# Patient Record
Sex: Male | Born: 1955 | Race: Black or African American | Hispanic: No | Marital: Married | Smoking: Never smoker
Health system: Southern US, Community
[De-identification: ages and names within clinical notes are randomized; demographics above are authoritative.]

## PROBLEM LIST (undated history)

## (undated) DIAGNOSIS — E7800 Pure hypercholesterolemia, unspecified: Secondary | ICD-10-CM

## (undated) DIAGNOSIS — I10 Essential (primary) hypertension: Secondary | ICD-10-CM

## (undated) HISTORY — DX: Pure hypercholesterolemia, unspecified: E78.00

## (undated) HISTORY — DX: Essential (primary) hypertension: I10

---

## 2013-05-25 DIAGNOSIS — K200 Eosinophilic esophagitis: Secondary | ICD-10-CM | POA: Insufficient documentation

## 2013-05-25 DIAGNOSIS — R1310 Dysphagia, unspecified: Secondary | ICD-10-CM | POA: Insufficient documentation

## 2018-03-08 ENCOUNTER — Emergency Department (HOSPITAL_COMMUNITY): Payer: Self-pay

## 2018-03-08 ENCOUNTER — Encounter (HOSPITAL_COMMUNITY): Payer: Self-pay | Admitting: Emergency Medicine

## 2018-03-08 ENCOUNTER — Other Ambulatory Visit: Payer: Self-pay

## 2018-03-08 ENCOUNTER — Emergency Department (HOSPITAL_COMMUNITY)
Admission: EM | Admit: 2018-03-08 | Discharge: 2018-03-08 | Disposition: A | Discharge status: Home / Self Care | Payer: Self-pay | Attending: Emergency Medicine | Admitting: Emergency Medicine

## 2018-03-08 DIAGNOSIS — K5900 Constipation, unspecified: Secondary | ICD-10-CM | POA: Insufficient documentation

## 2018-03-08 DIAGNOSIS — N289 Disorder of kidney and ureter, unspecified: Secondary | ICD-10-CM

## 2018-03-08 LAB — CBC
HCT: 47.5 % (ref 39.0–52.0)
Hemoglobin: 15.6 g/dL (ref 13.0–17.0)
MCH: 29.8 pg (ref 26.0–34.0)
MCHC: 32.8 g/dL (ref 30.0–36.0)
MCV: 90.6 fL (ref 80.0–100.0)
NRBC: 0 % (ref 0.0–0.2)
PLATELETS: 226 10*3/uL (ref 150–400)
RBC: 5.24 MIL/uL (ref 4.22–5.81)
RDW: 11.6 % (ref 11.5–15.5)
WBC: 11.7 10*3/uL — AB (ref 4.0–10.5)

## 2018-03-08 LAB — COMPREHENSIVE METABOLIC PANEL
ALK PHOS: 47 U/L (ref 38–126)
ALT: 13 U/L (ref 0–44)
ANION GAP: 11 (ref 5–15)
AST: 14 U/L — ABNORMAL LOW (ref 15–41)
Albumin: 3.9 g/dL (ref 3.5–5.0)
BILIRUBIN TOTAL: 1.5 mg/dL — AB (ref 0.3–1.2)
BUN: 19 mg/dL (ref 8–23)
CO2: 25 mmol/L (ref 22–32)
Calcium: 9.2 mg/dL (ref 8.9–10.3)
Chloride: 101 mmol/L (ref 98–111)
Creatinine, Ser: 2.07 mg/dL — ABNORMAL HIGH (ref 0.61–1.24)
GFR, EST AFRICAN AMERICAN: 38 mL/min — AB (ref >60)
GFR, EST NON AFRICAN AMERICAN: 33 mL/min — AB (ref >60)
Glucose, Bld: 109 mg/dL — ABNORMAL HIGH (ref 70–99)
Potassium: 4.4 mmol/L (ref 3.5–5.1)
Sodium: 137 mmol/L (ref 135–145)
TOTAL PROTEIN: 7.6 g/dL (ref 6.5–8.1)

## 2018-03-08 LAB — URINALYSIS, ROUTINE W REFLEX MICROSCOPIC
Bacteria, UA: NONE SEEN
Bilirubin Urine: NEGATIVE
GLUCOSE, UA: NEGATIVE mg/dL
Ketones, ur: 5 mg/dL — AB
Nitrite: NEGATIVE
PH: 6 (ref 5.0–8.0)
PROTEIN: NEGATIVE mg/dL
Specific Gravity, Urine: 1.019 (ref 1.005–1.030)

## 2018-03-08 LAB — LIPASE, BLOOD: Lipase: 33 U/L (ref 11–51)

## 2018-03-08 MED ORDER — MILK AND MOLASSES ENEMA
1.0000 | Freq: Once | RECTAL | Status: AC
  Administered 2018-03-08: 250 mL via RECTAL
  Filled 2018-03-08: qty 250

## 2018-03-08 NOTE — Discharge Instructions (Addendum)
Drink plenty of fluids. Take miralax 1 to 3 times daily. Take colace 1 capsule daily. Take magnesium citrate or senna for severe constipation. Follow up with primary doctor for your kidney function.

## 2018-03-08 NOTE — ED Provider Notes (Signed)
[facility] EMERGENCY DEPARTMENT Provider Note   CSN: [id number] Arrival date & time: 03/08/18  0428     History   Chief Complaint Chief Complaint  Patient presents with  . Constipation    HPI Raymond Cruz is a 62 y.o. male.  62 year old male who presents with constipation and bloating.  Patient states that for the past 4 days, he has had constipation associated with bloating.  At one point he had some abdominal pain but does not have any pain currently.  He has tried 2 bottles of magnesium citrate, Dulcolax, Epsom salts, warm towels, and fleets enema with no relief.  He passed several hard balls of stool 2 days ago but nothing since then.  He is passing gas.  He has had nausea but no vomiting and no fevers or urinary symptoms.  The history is provided by the patient.  Constipation      History reviewed. No pertinent past medical history.  There are no active problems to display for this patient.   History reviewed. No pertinent surgical history.      Home Medications    Prior to Admission medications   Not on File    Family History History reviewed. No pertinent family history.  Social History Social History   Tobacco Use  . Smoking status: Never Smoker  . Smokeless tobacco: Never Used  Substance Use Topics  . Alcohol use: Never    Frequency: Never  . Drug use: Never     Allergies   Patient has no known allergies.   Review of Systems Review of Systems  Gastrointestinal: Positive for constipation.   All other systems reviewed and are negative except that which was mentioned in HPI   Physical Exam Updated Vital Signs BP 132/80   Pulse (!) 58   Temp 98 F (36.7 C) (Oral)   Resp (!) 25   Ht 5\' 8"  (1.727 m)   Wt 99.8 kg   SpO2 96%   BMI 33.45 kg/m   Physical Exam  Constitutional: He is oriented to person, place, and time. He appears well-developed and well-nourished. No distress.  HENT:  Head: Normocephalic and  atraumatic.  Moist mucous membranes  Eyes: Conjunctivae are normal.  Neck: Neck supple.  Cardiovascular: Normal rate, regular rhythm and normal heart sounds.  No murmur heard. Pulmonary/Chest: Effort normal and breath sounds normal.  Abdominal: Soft. He exhibits no distension. Bowel sounds are increased. There is no tenderness.  Musculoskeletal: He exhibits no edema.  Neurological: He is alert and oriented to person, place, and time.  Fluent speech  Skin: Skin is warm and dry.  Psychiatric: He has a normal mood and affect. Judgment normal.  Nursing note and vitals reviewed.    ED Treatments / Results  Labs (all labs ordered are listed, but only abnormal results are displayed) Labs Reviewed  COMPREHENSIVE METABOLIC PANEL - Abnormal; Notable for the following components:      Result Value   Glucose, Bld 109 (*)    Creatinine, Ser 2.07 (*)    AST 14 (*)    Total Bilirubin 1.5 (*)    GFR calc non Af Amer 33 (*)    GFR calc Af Amer 38 (*)    All other components within normal limits  CBC - Abnormal; Notable for the following components:   WBC 11.7 (*)    All other components within normal limits  URINALYSIS, ROUTINE W REFLEX MICROSCOPIC - Abnormal; Notable for the following components:   Hgb urine  dipstick MODERATE (*)    Ketones, ur 5 (*)    Leukocytes, UA TRACE (*)    All other components within normal limits  LIPASE, BLOOD    EKG None  Radiology Dg Abd 2 Views  Result Date: 03/08/2018 CLINICAL DATA:  Initial evaluation for acute abdominal pain, constipation. EXAM: ABDOMEN - 2 VIEW COMPARISON:  None. FINDINGS: Bowel gas pattern within normal limits without obstruction or ileus. No abnormal bowel wall thickening. No free air seen underneath the hemidiaphragms on upright view. Overall stool burden is mild in nature. No soft tissue mass or abnormal calcification. Visualized osseous structures within normal limits. IMPRESSION: 1. Nonobstructive bowel gas pattern with no  radiographic evidence for acute intra-abdominal process. 2. Overall stool burden is relatively mild in nature. Electronically Signed   By: Rise Mu M.D.   On: 03/08/2018 06:32    Procedures Procedures (including critical care time)  Medications Ordered in ED Medications  milk and molasses enema (has no administration in time range)     Initial Impression / Assessment and Plan / ED Course  I have reviewed the triage vital signs and the nursing notes.  Pertinent labs & imaging results that were available during my care of the patient were reviewed by me and considered in my medical decision making (see chart for details).    Well-appearing on exam, abdomen nontender, he did have good bowel sounds.  He denies any abdominal pain.  His lab work shows normal LFTs and lipase.  Incidental finding of creatinine of 2.  The patient has had no vomiting or decreased intake to suggest dehydration.  He never goes to the doctor and is been a long time since he has had a well check, I suspect he may have chronic kidney disease potentially related to hypertension.  His wife assures me that they will contact the clinic tomorrow for appointment for further work-up of his kidney disease.  In the meantime, he will continue to drink plenty of fluids.  Running his constipation, gave enema in the ED after which patient had large bowel movement and stated he felt much better.  Discussed supportive measures including medications to continue at home.  Extensively reviewed return precautions and they voiced understanding.  Final Clinical Impressions(s) / ED Diagnoses   Final diagnoses:  Constipation    ED Discharge Orders    None       Cookie Pore, Ambrose Finland, MD 03/08/18 [phone]

## 2018-03-08 NOTE — ED Triage Notes (Signed)
Pt given M/M enema  With results

## 2018-03-08 NOTE — ED Triage Notes (Signed)
Pt reports constipation, abdominal pain for past 4 days. Reports small, meatball-size stool 2 days ago, but only small liquid since then. Passing gas today. Mild nausea during period as well. Has tried PO treatments without relief.

## 2018-09-22 ENCOUNTER — Ambulatory Visit (INDEPENDENT_AMBULATORY_CARE_PROVIDER_SITE_OTHER): Payer: BLUE CROSS/BLUE SHIELD

## 2018-09-22 ENCOUNTER — Encounter: Payer: Self-pay | Admitting: Podiatry

## 2018-09-22 ENCOUNTER — Other Ambulatory Visit: Payer: Self-pay

## 2018-09-22 ENCOUNTER — Ambulatory Visit: Payer: BLUE CROSS/BLUE SHIELD | Admitting: Podiatry

## 2018-09-22 VITALS — Temp 97.7°F

## 2018-09-22 DIAGNOSIS — T148XXA Other injury of unspecified body region, initial encounter: Secondary | ICD-10-CM

## 2018-09-22 DIAGNOSIS — M792 Neuralgia and neuritis, unspecified: Secondary | ICD-10-CM | POA: Diagnosis not present

## 2018-09-22 DIAGNOSIS — S9032XA Contusion of left foot, initial encounter: Secondary | ICD-10-CM

## 2018-09-22 MED ORDER — DICLOFENAC SODIUM 1 % TD GEL: 2.0000 g | Freq: Four times a day (QID) | TRANSDERMAL | 0 refills | Status: DC

## 2018-09-22 NOTE — Progress Notes (Signed)
Subjective:   Patient ID: Raymond Cruz, male   DOB: 63 y.o.   MRN: [id number]   HPI 63 year old male presents the office today for concerns of numbness across the top of his right first toe from the MPJ.  States he dropped a heavy piece of metal on the foot about 2 weeks ago and was swollen and he had bruising to the area.  He states that he did not feel he broke anything he feels that the bone is bruised but he wants to have the area checked.  He has been soaking his foot.  This did occur while he was at work.  He is able to wear regular shoe without any pain.  He states that he just wants to have the area checked.  Review of Systems  All other systems reviewed and are negative.  No past medical history on file.  No past surgical history on file.   Current Outpatient Medications:  .  amoxicillin (AMOXIL) 500 MG tablet, TAKE 1 TABLET BY MOUTH EVERY 8 HOURS UNTIL GONE, Disp: , Rfl:  .  diclofenac sodium (VOLTAREN) 1 % GEL, Apply 2 g topically 4 (four) times daily. Rub into affected area of foot 2 to 4 times daily, Disp: 100 g, Rfl: 0  No Known Allergies      Objective:  Physical Exam  General: AAO x3, NAD  Dermatological: Evidence of old bruising to the hallux proximal plantar aspect as well as medial aspect.  No open sores.  There are no open sores, no preulcerative lesions, no rash or signs of infection present.  Vascular: Dorsalis Pedis artery and Posterior Tibial artery pedal pulses are 2/4 bilateral with immedate capillary fill time. Pedal hair growth present. No varicosities and no lower extremity edema present bilateral. There is no pain with calf compression, swelling, warmth, erythema.   Neruologic: Grossly intact via light touch bilateral. Vibratory intact via tuning fork bilateral. Protective threshold with Semmes Wienstein monofilament intact to all pedal sites bilateral.  Negative Tinel sign.  Musculoskeletal: There is not significant numbness to the dorsal aspect of  the right foot mostly along the hallux.  There is no pain to palpation.  There is no significant edema.  There is no pain with MPJ range of motion.  There is no other areas of pinpoint tenderness.  No pain palpated sensation.  Muscular strength 5/5 in all groups tested bilateral.  Gait: Unassisted, Nonantalgic.       Assessment:   64 year old male left foot bone contusion, neuritis     Plan:  -Treatment options discussed including all alternatives, risks, and complications -Etiology of symptoms were discussed -X-rays were obtained and reviewed with the patient.  There is no definitive evidence of acute fracture identified today. -He is able to wear shoes with any difficulties today, cannot localize it.  He states that wearing stiffer shoes also been very helpful at work.  Prescribed Voltaren gel to see if this will help as well.  If not resolved next 4 weeks to let me know.  We discussed that next infections can take some time to heal.  Vivi Barrack DPM

## 2018-10-16 ENCOUNTER — Other Ambulatory Visit: Payer: Self-pay | Admitting: Podiatry

## 2019-11-10 ENCOUNTER — Ambulatory Visit: Payer: BLUE CROSS/BLUE SHIELD | Attending: Internal Medicine

## 2019-11-10 DIAGNOSIS — Z20822 Contact with and (suspected) exposure to covid-19: Secondary | ICD-10-CM

## 2019-11-11 LAB — NOVEL CORONAVIRUS, NAA: SARS-CoV-2, NAA: NOT DETECTED

## 2019-11-11 LAB — SARS-COV-2, NAA 2 DAY TAT

## 2020-04-17 IMAGING — DX DG ABDOMEN 2V
2 series · 2 of 2 positions shown · non-contrast
Comparison: None.

CLINICAL DATA: Initial evaluation for acute abdominal pain,
constipation.

EXAM:
ABDOMEN - 2 VIEW

[abdomen erect]
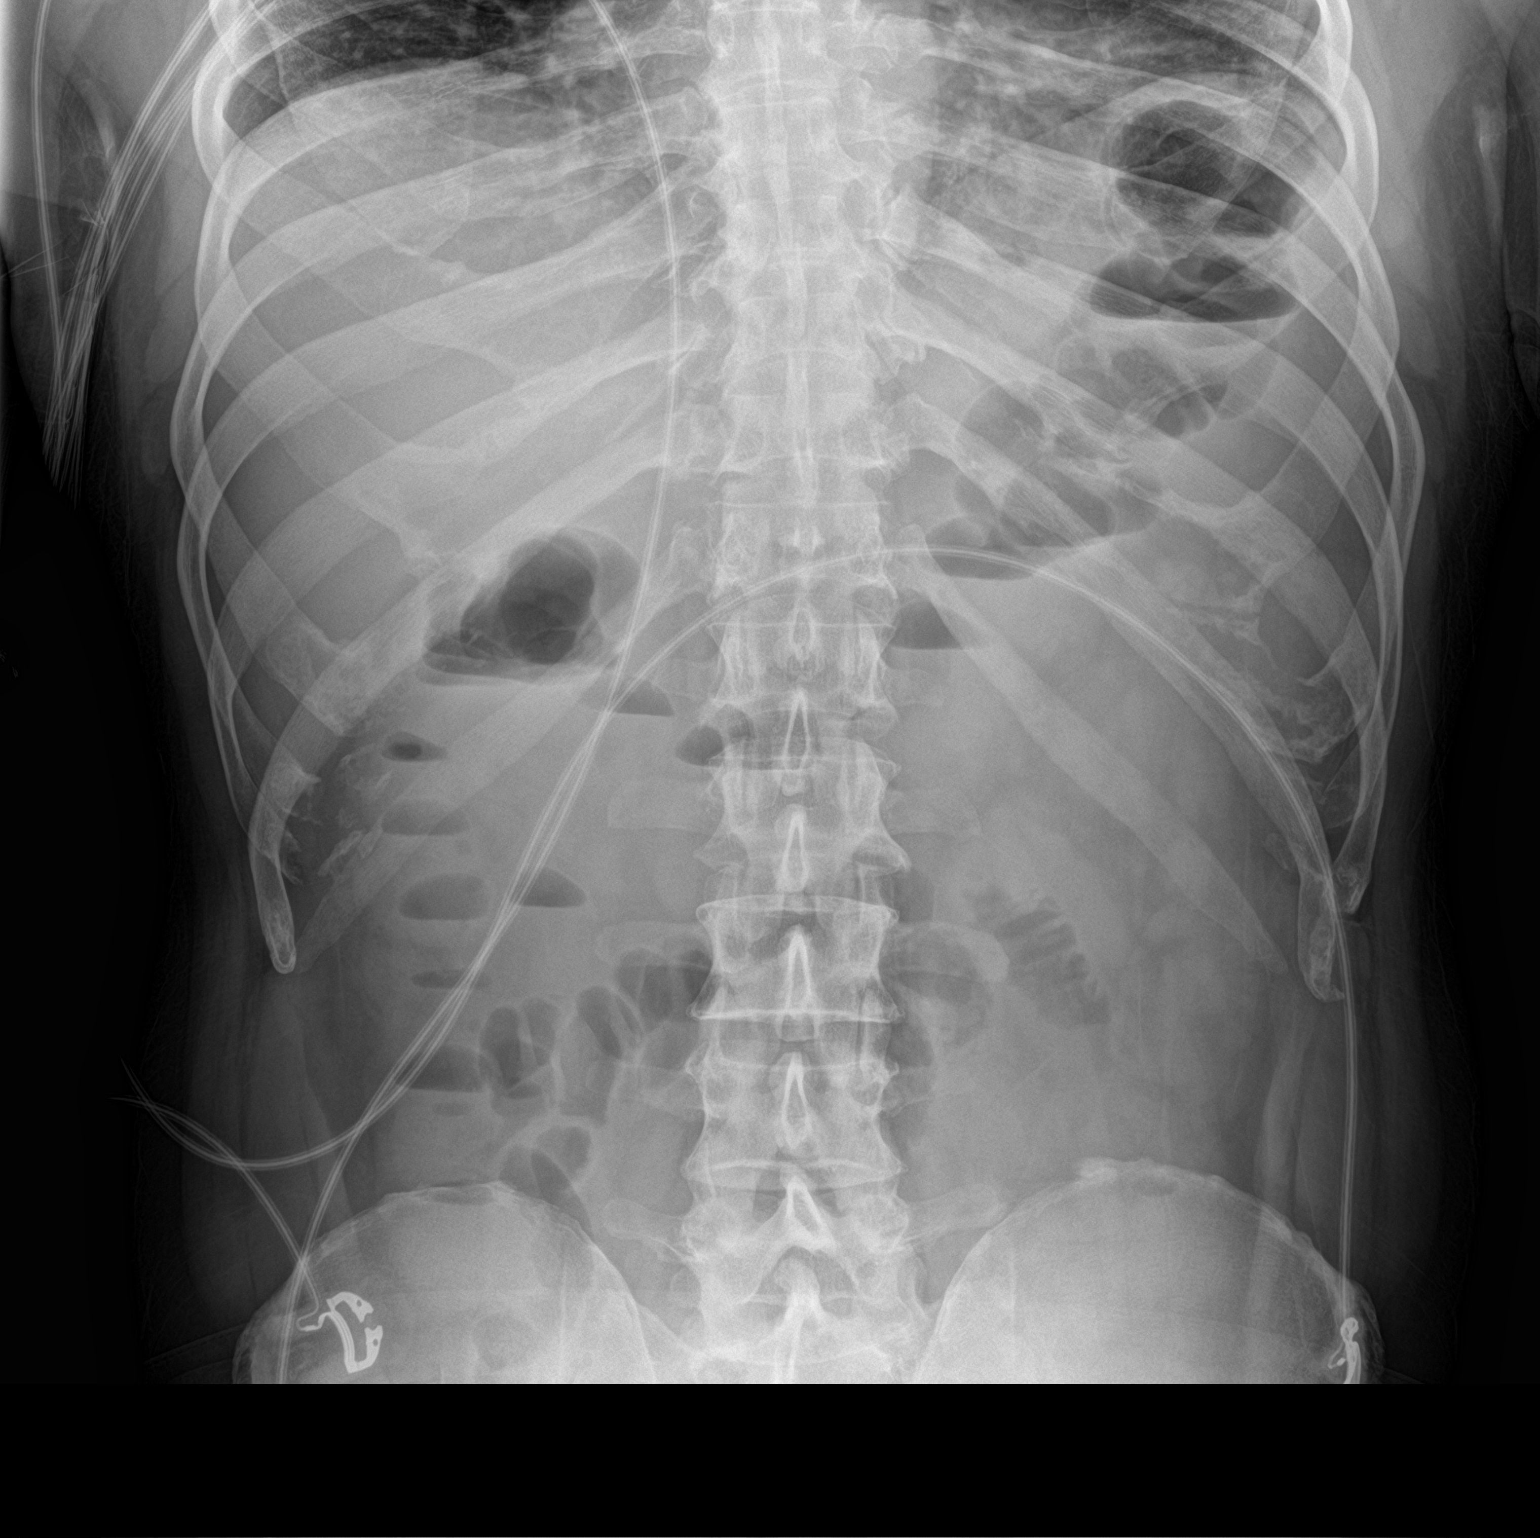

[abdomen supine]
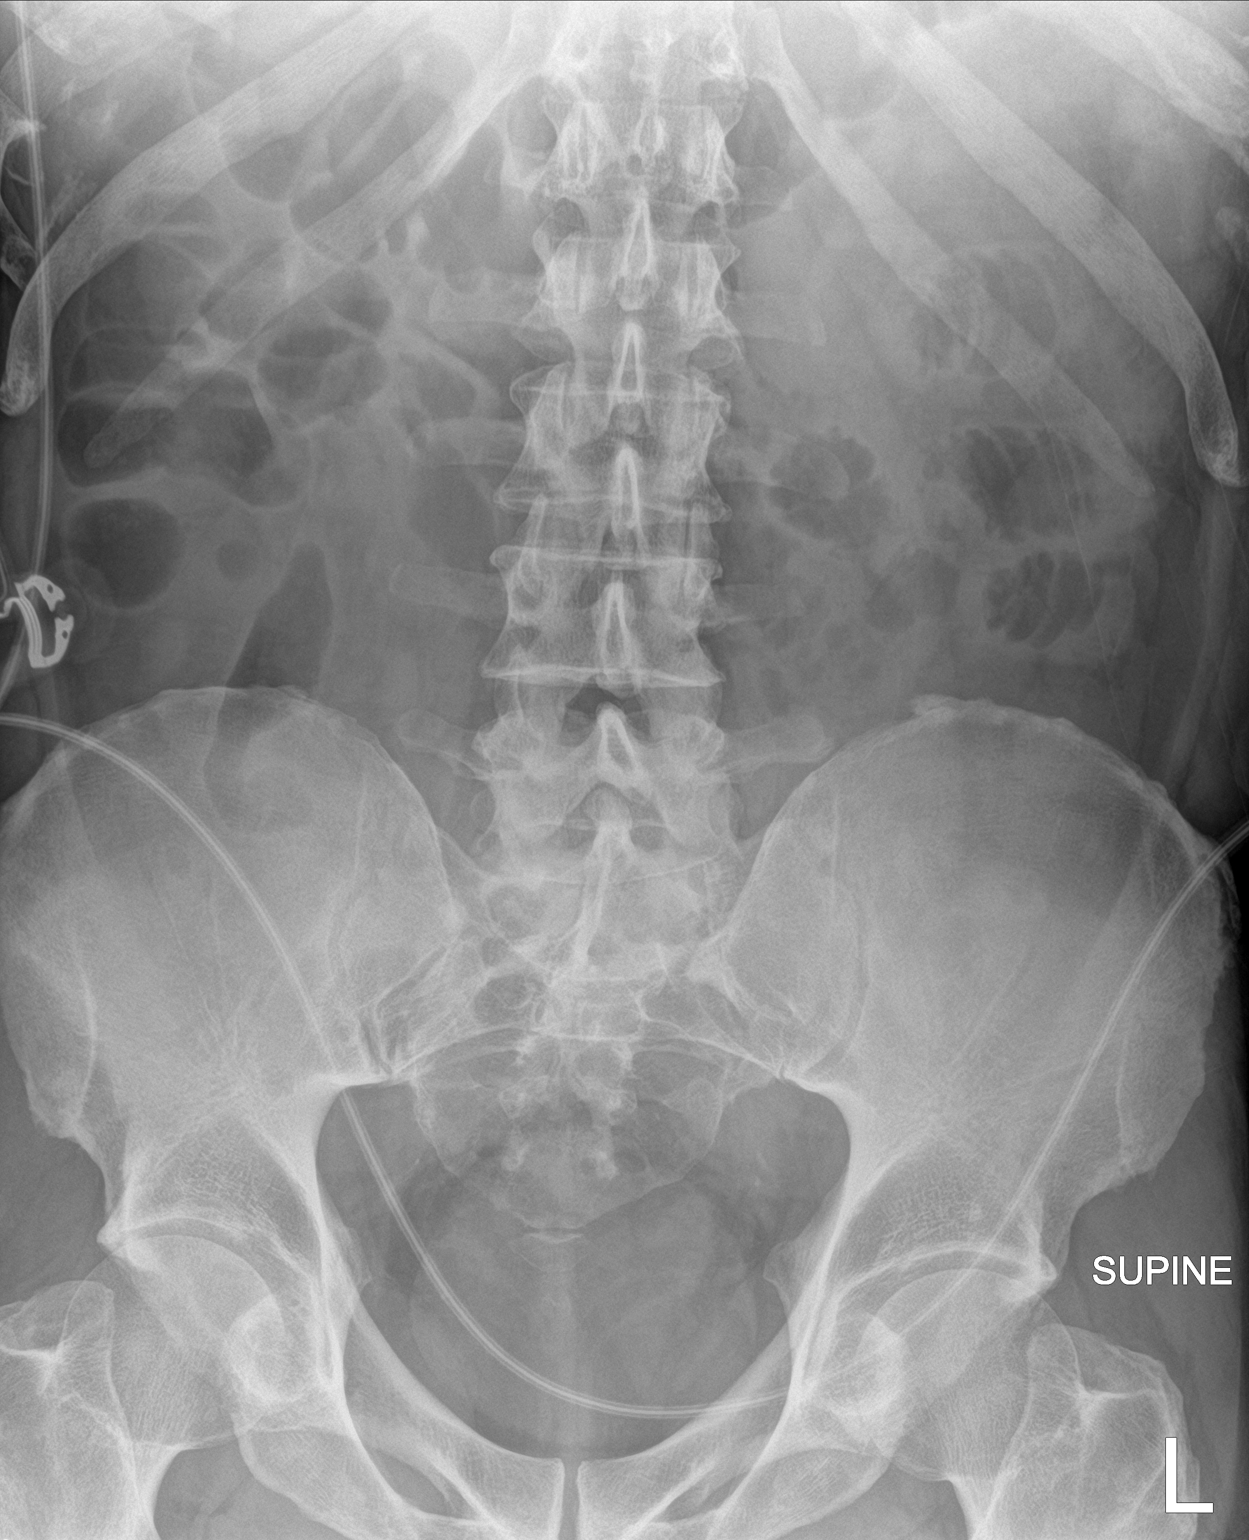

[2 of 2 positions shown; findings below may reference images not displayed]

FINDINGS: Bowel gas pattern within normal limits without obstruction or ileus.
No abnormal bowel wall thickening. No free air seen underneath the
hemidiaphragms on upright view. Overall stool burden is mild in
nature. No soft tissue mass or abnormal calcification.

Visualized osseous structures within normal limits.
IMPRESSION: 1. Nonobstructive bowel gas pattern with no radiographic evidence
for acute intra-abdominal process.
2. Overall stool burden is relatively mild in nature.

## 2022-01-22 ENCOUNTER — Ambulatory Visit: Payer: Self-pay | Admitting: Family Medicine

## 2022-01-30 ENCOUNTER — Encounter: Payer: Self-pay | Admitting: Nurse Practitioner

## 2022-01-30 ENCOUNTER — Ambulatory Visit (INDEPENDENT_AMBULATORY_CARE_PROVIDER_SITE_OTHER): Payer: Medicare HMO | Admitting: Nurse Practitioner

## 2022-01-30 VITALS — BP 118/70 | HR 64 | Ht 68.5 in | Wt 215.8 lb

## 2022-01-30 DIAGNOSIS — Z6832 Body mass index (BMI) 32.0-32.9, adult: Secondary | ICD-10-CM | POA: Insufficient documentation

## 2022-01-30 DIAGNOSIS — I10 Essential (primary) hypertension: Secondary | ICD-10-CM

## 2022-01-30 DIAGNOSIS — Z23 Encounter for immunization: Secondary | ICD-10-CM

## 2022-01-30 DIAGNOSIS — R5383 Other fatigue: Secondary | ICD-10-CM | POA: Insufficient documentation

## 2022-01-30 DIAGNOSIS — Z7689 Persons encountering health services in other specified circumstances: Secondary | ICD-10-CM

## 2022-01-30 DIAGNOSIS — Z0000 Encounter for general adult medical examination without abnormal findings: Secondary | ICD-10-CM

## 2022-01-30 DIAGNOSIS — E782 Mixed hyperlipidemia: Secondary | ICD-10-CM | POA: Diagnosis not present

## 2022-01-30 DIAGNOSIS — Z125 Encounter for screening for malignant neoplasm of prostate: Secondary | ICD-10-CM

## 2022-01-30 MED ORDER — AMLODIPINE BESYLATE 2.5 MG PO TABS: 2.5000 mg | ORAL_TABLET | Freq: Every day | ORAL | 1 refills | Status: DC

## 2022-01-30 MED ORDER — ROSUVASTATIN CALCIUM 10 MG PO TABS: 10.0000 mg | ORAL_TABLET | Freq: Every day | ORAL | 1 refills | Status: DC

## 2022-01-30 NOTE — Progress Notes (Signed)
New Patient Office Visit  Subjective    Patient ID: Raymond Cruz, male    DOB: 04-30-1955  Age: 66 y.o. MRN: [id number]  CC:  Chief Complaint  Patient presents with   New Patient (Initial Visit)    HPI Raymond Cruz presents to establish care The patient's prior PCP has left the practice. He [place] by the studio.  He is due to have routine physical and labs. Would like ot have prostate cancer screening.  -would like to have flu shot today   Outpatient Encounter Medications as of 01/30/2022  Medication Sig   [DISCONTINUED] rosuvastatin (CRESTOR) 10 MG tablet Take 1 tablet by mouth daily.   amLODipine (NORVASC) 2.5 MG tablet Take 1 tablet (2.5 mg total) by mouth daily.   rosuvastatin (CRESTOR) 10 MG tablet Take 1 tablet (10 mg total) by mouth daily.   [DISCONTINUED] amLODipine (NORVASC) 2.5 MG tablet Take 2.5 mg by mouth daily.   [DISCONTINUED] amoxicillin (AMOXIL) 500 MG tablet TAKE 1 TABLET BY MOUTH EVERY 8 HOURS UNTIL GONE   [DISCONTINUED] diclofenac sodium (VOLTAREN) 1 % GEL APPLY 2 G TOPICALLY 4 (FOUR) TIMES DAILY. RUB INTO AFFECTED AREA OF FOOT 2 TO 4 TIMES DAILY   No facility-administered encounter medications on file as of 01/30/2022.    Past Medical History:  Diagnosis Date   High blood pressure    High cholesterol     History reviewed. No pertinent surgical history.  Family History  Problem Relation Age of Onset   Diabetes Mother    High Cholesterol Mother    High blood pressure Mother    High blood pressure Father    High Cholesterol Father    Heart attack Father     Social History   Socioeconomic History   Marital status: Married    Spouse name: Not on file   Number of children: Not on file   Years of education: Not on file   Highest education level: Not on file  Occupational History   Not on file  Tobacco Use   Smoking status: Never   Smokeless tobacco: Never  Vaping Use   Vaping Use: Never used  Substance and Sexual Activity    Alcohol use: Yes   Drug use: Never   Sexual activity: Yes    Partners: Female  Other Topics Concern   Not on file  Social History Narrative   Not on file   Social Determinants of Health   Financial Resource Strain: Not on file  Food Insecurity: Not on file  Transportation Needs: Not on file  Physical Activity: Not on file  Stress: Not on file  Social Connections: Not on file  Intimate Partner Violence: Not on file    Review of Systems  Constitutional:  Negative for chills, fever and malaise/fatigue.  HENT:  Negative for congestion, sinus pain and sore throat.   Eyes: Negative.   Respiratory:  Negative for cough, shortness of breath and wheezing.   Cardiovascular:  Negative for chest pain, palpitations and leg swelling.  Gastrointestinal:  Negative for constipation, diarrhea, nausea and vomiting.  Genitourinary: Negative.   Musculoskeletal:  Negative for myalgias.  Skin: Negative.   Neurological:  Negative for dizziness and headaches.  Endo/Heme/Allergies:  Does not bruise/bleed easily.  Psychiatric/Behavioral:  Negative for depression. The patient is not nervous/anxious.         Objective    Today's Vitals   01/30/22 1049  BP: 118/70  Pulse: 64  SpO2: 99%  Weight: 215 lb 12.8 oz (  97.9 kg)  Height: 5' 8.5" (1.74 m)   Body mass index is 32.34 kg/m.   Physical Exam Vitals and nursing note reviewed.  Constitutional:      Appearance: Normal appearance. He is well-developed.  HENT:     Head: Normocephalic and atraumatic.     Nose: Nose normal.     Mouth/Throat:     Mouth: Mucous membranes are moist.     Pharynx: Oropharynx is clear.  Eyes:     Extraocular Movements: Extraocular movements intact.     Conjunctiva/sclera: Conjunctivae normal.     Pupils: Pupils are equal, round, and reactive to light.  Cardiovascular:     Rate and Rhythm: Normal rate and regular rhythm.     Pulses: Normal pulses.     Heart sounds: Normal heart sounds.  Pulmonary:      Effort: Pulmonary effort is normal.     Breath sounds: Normal breath sounds.  Abdominal:     Palpations: Abdomen is soft.  Musculoskeletal:        General: Normal range of motion.     Cervical back: Normal range of motion and neck supple.  Lymphadenopathy:     Cervical: No cervical adenopathy.  Skin:    General: Skin is warm and dry.     Capillary Refill: Capillary refill takes less than 2 seconds.  Neurological:     General: No focal deficit present.     Mental Status: He is alert and oriented to person, place, and time.  Psychiatric:        Mood and Affect: Mood normal.        Behavior: Behavior normal.        Thought Content: Thought content normal.        Judgment: Judgment normal.          Assessment & Plan:  1. Essential hypertension Stable. Continue amlodipine as prescribed. Check routine fasting labs prior to next visit  - amLODipine (NORVASC) 2.5 MG tablet; Take 1 tablet (2.5 mg total) by mouth daily.  Dispense: 90 tablet; Refill: 1 - Comprehensive metabolic panel; Future - CBC; Future - Hemoglobin A1c; Future  2. Mixed hyperlipidemia Check fasting lipids prior to next visit. Adjust dose of crestor as indicated  - rosuvastatin (CRESTOR) 10 MG tablet; Take 1 tablet (10 mg total) by mouth daily.  Dispense: 90 tablet; Refill: 1 - Lipid panel; Future - Comprehensive metabolic panel; Future - CBC; Future - Hemoglobin A1c; Future  3. Other fatigue Check thyroid panel  - TSH + free T4; Future  4. BMI 32.0-32.9,adult Discussed lowering calorie intake to 1500 calories per day and incorporating exercise into daily routine to help lose weight.   5. Screening for prostate cancer Check PSA with routine, fasting labs  - PSA; Future  6. Need for influenza vaccination Flu shot given during today's visit  - Flu Vaccine QUAD 6+ mos PF IM (Fluarix Quad PF)  7. Healthcare maintenance Routine, fasting labs ordered during today's visit.  - TSH + free T4; Future -  Hemoglobin A1c; Future  8. Encounter to establish care Appointment today to establish new primary care provider     Problem List Items Addressed This Visit       Cardiovascular and Mediastinum   Essential hypertension - Primary   Relevant Medications   amLODipine (NORVASC) 2.5 MG tablet   rosuvastatin (CRESTOR) 10 MG tablet   Other Relevant Orders   Comprehensive metabolic panel   CBC   Hemoglobin A1c  Other   Mixed hyperlipidemia   Relevant Medications   amLODipine (NORVASC) 2.5 MG tablet   rosuvastatin (CRESTOR) 10 MG tablet   Other Relevant Orders   Lipid panel   Comprehensive metabolic panel   CBC   Hemoglobin A1c   Other fatigue   Relevant Orders   TSH + free T4   BMI 32.0-32.9,adult   Other Visit Diagnoses     Screening for prostate cancer       Relevant Orders   PSA   Need for influenza vaccination       Relevant Orders   Flu Vaccine QUAD 6+ mos PF IM (Fluarix Quad PF) (Completed)   Healthcare maintenance       Relevant Orders   TSH + free T4   Hemoglobin A1c   Encounter to establish care           Return in about 2 months (around 04/01/2022) for health maintenance exam - would like to get routine, fasting labs done this monday if possible. Marland Kitchen   Carlean Jews, NP

## 2022-02-04 ENCOUNTER — Other Ambulatory Visit: Payer: Medicare HMO

## 2022-02-08 ENCOUNTER — Other Ambulatory Visit: Payer: Medicare HMO

## 2022-02-08 DIAGNOSIS — R5383 Other fatigue: Secondary | ICD-10-CM

## 2022-02-08 DIAGNOSIS — Z0000 Encounter for general adult medical examination without abnormal findings: Secondary | ICD-10-CM

## 2022-02-08 DIAGNOSIS — E782 Mixed hyperlipidemia: Secondary | ICD-10-CM

## 2022-02-08 DIAGNOSIS — I10 Essential (primary) hypertension: Secondary | ICD-10-CM

## 2022-02-08 DIAGNOSIS — Z125 Encounter for screening for malignant neoplasm of prostate: Secondary | ICD-10-CM

## 2022-02-09 LAB — COMPREHENSIVE METABOLIC PANEL
ALT: 24 IU/L (ref 0–44)
AST: 26 IU/L (ref 0–40)
Albumin/Globulin Ratio: 2.3 — ABNORMAL HIGH (ref 1.2–2.2)
Albumin: 4.9 g/dL (ref 3.9–4.9)
Alkaline Phosphatase: 58 IU/L (ref 44–121)
BUN/Creatinine Ratio: 16 (ref 10–24)
BUN: 17 mg/dL (ref 8–27)
Bilirubin Total: 1.9 mg/dL — ABNORMAL HIGH (ref 0.0–1.2)
CO2: 20 mmol/L (ref 20–29)
Calcium: 9.2 mg/dL (ref 8.6–10.2)
Chloride: 103 mmol/L (ref 96–106)
Creatinine, Ser: 1.05 mg/dL (ref 0.76–1.27)
Globulin, Total: 2.1 g/dL (ref 1.5–4.5)
Glucose: 90 mg/dL (ref 70–99)
Potassium: 4.4 mmol/L (ref 3.5–5.2)
Sodium: 140 mmol/L (ref 134–144)
Total Protein: 7 g/dL (ref 6.0–8.5)
eGFR: 79 mL/min/{1.73_m2} (ref >59)

## 2022-02-09 LAB — HEMOGLOBIN A1C
Est. average glucose Bld gHb Est-mCnc: 126 mg/dL
Hgb A1c MFr Bld: 6 % — ABNORMAL HIGH (ref 4.8–5.6)

## 2022-02-09 LAB — CBC
Hematocrit: 47.5 % (ref 37.5–51.0)
Hemoglobin: 16.1 g/dL (ref 13.0–17.7)
MCH: 30.4 pg (ref 26.6–33.0)
MCHC: 33.9 g/dL (ref 31.5–35.7)
MCV: 90 fL (ref 79–97)
Platelets: 244 10*3/uL (ref 150–450)
RBC: 5.3 x10E6/uL (ref 4.14–5.80)
RDW: 12.1 % (ref 11.6–15.4)
WBC: 5.9 10*3/uL (ref 3.4–10.8)

## 2022-02-09 LAB — TSH+FREE T4
Free T4: 1.35 ng/dL (ref 0.82–1.77)
TSH: 1.17 u[IU]/mL (ref 0.450–4.500)

## 2022-02-09 LAB — LIPID PANEL
Chol/HDL Ratio: 2.5 ratio (ref 0.0–5.0)
Cholesterol, Total: 158 mg/dL (ref 100–199)
HDL: 62 mg/dL (ref >39)
LDL Chol Calc (NIH): 86 mg/dL (ref 0–99)
Triglycerides: 47 mg/dL (ref 0–149)
VLDL Cholesterol Cal: 10 mg/dL (ref 5–40)

## 2022-02-09 LAB — PSA: Prostate Specific Ag, Serum: 0.3 ng/mL (ref 0.0–4.0)

## 2022-04-02 ENCOUNTER — Ambulatory Visit (INDEPENDENT_AMBULATORY_CARE_PROVIDER_SITE_OTHER): Payer: Medicare HMO | Admitting: Nurse Practitioner

## 2022-04-02 ENCOUNTER — Encounter: Payer: Self-pay | Admitting: Nurse Practitioner

## 2022-04-02 VITALS — BP 137/79 | HR 62 | Resp 18 | Ht 68.5 in | Wt 216.0 lb

## 2022-04-02 DIAGNOSIS — Z6832 Body mass index (BMI) 32.0-32.9, adult: Secondary | ICD-10-CM

## 2022-04-02 DIAGNOSIS — E782 Mixed hyperlipidemia: Secondary | ICD-10-CM

## 2022-04-02 DIAGNOSIS — Z0001 Encounter for general adult medical examination with abnormal findings: Secondary | ICD-10-CM

## 2022-04-02 DIAGNOSIS — Z1211 Encounter for screening for malignant neoplasm of colon: Secondary | ICD-10-CM

## 2022-04-02 DIAGNOSIS — I10 Essential (primary) hypertension: Secondary | ICD-10-CM | POA: Diagnosis not present

## 2022-04-02 NOTE — Progress Notes (Signed)
Complete physical exam   Patient: Raymond Cruz   DOB: 11-07-1955   66 y.o. Male  MRN: [id number] Visit Date: 04/02/2022    Chief Complaint  Patient presents with   Health Maintenance   Subjective    Raymond Cruz is a 66 y.o. male who presents today for a complete physical exam.  He reports consuming a  generally healthy  diet. Home exercise routine includes biking and walking with weights. He generally feels well. He does not have additional problems to discuss today.   HPI  Annual physical  -well controlled hypertension  -well managed high chortosterol  -routine fasting labs done prior to today  --HgbA1c 6.0 with normal blood sugar  --other labs essentially normal.   .denies chest pain, chest pressure, or shortness of breath. He denies headaches or visual disturbances. He denies abdominal pain, nausea, vomiting, or changes in bowel or bladder habits.    Past Medical History:  Diagnosis Date   High blood pressure    High cholesterol    History reviewed. No pertinent surgical history. Social History   Socioeconomic History   Marital status: Married    Spouse name: Not on file   Number of children: Not on file   Years of education: Not on file   Highest education level: Not on file  Occupational History   Not on file  Tobacco Use   Smoking status: Never   Smokeless tobacco: Never  Vaping Use   Vaping Use: Never used  Substance and Sexual Activity   Alcohol use: Yes   Drug use: Never   Sexual activity: Yes    Partners: Female  Other Topics Concern   Not on file  Social History Narrative   Not on file   Social Determinants of Health   Financial Resource Strain: Not on file  Food Insecurity: Not on file  Transportation Needs: Not on file  Physical Activity: Not on file  Stress: Not on file  Social Connections: Not on file  Intimate Partner Violence: Not on file   Family Status  Relation Name Status   Mother  (Not Specified)   Father  (Not  Specified)   Family History  Problem Relation Age of Onset   Diabetes Mother    High Cholesterol Mother    High blood pressure Mother    High blood pressure Father    High Cholesterol Father    Heart attack Father    No Known Allergies  Patient Care Team: Ronnell Freshwater, NP as PCP - General (Family Medicine)   Medications: Outpatient Medications Prior to Visit  Medication Sig   amLODipine (NORVASC) 2.5 MG tablet Take 1 tablet (2.5 mg total) by mouth daily.   rosuvastatin (CRESTOR) 10 MG tablet Take 1 tablet (10 mg total) by mouth daily.   No facility-administered medications prior to visit.    Review of Systems  Constitutional:  Negative for activity change, chills, fatigue and fever.  HENT:  Negative for congestion, postnasal drip, rhinorrhea, sinus pressure, sinus pain, sneezing and sore throat.   Eyes: Negative.   Respiratory:  Negative for cough, shortness of breath and wheezing.   Cardiovascular:  Negative for chest pain and palpitations.       Well controlled blood pressure   Gastrointestinal:  Negative for constipation, diarrhea, nausea and vomiting.  Endocrine: Negative for cold intolerance, heat intolerance, polydipsia and polyuria.  Genitourinary:  Negative for dysuria, frequency and urgency.  Musculoskeletal:  Negative for back pain and myalgias.  Skin:  Negative  for rash.  Allergic/Immunologic: Negative for environmental allergies.  Neurological:  Negative for dizziness, weakness and headaches.       Migraine headaches - mostly around the time of her menstrual period   Psychiatric/Behavioral:  The patient is not nervous/anxious.     Last CBC Lab Results  Component Value Date   WBC 5.9 02/08/2022   HGB 16.1 02/08/2022   HCT 47.5 02/08/2022   MCV 90 02/08/2022   MCH 30.4 02/08/2022   RDW 12.1 02/08/2022   PLT 244 09/32/6712   Last metabolic panel Lab Results  Component Value Date   GLUCOSE 90 02/08/2022   NA 140 02/08/2022   K 4.4 02/08/2022    CL 103 02/08/2022   CO2 20 02/08/2022   BUN 17 02/08/2022   CREATININE 1.05 02/08/2022   EGFR 79 02/08/2022   CALCIUM 9.2 02/08/2022   PROT 7.0 02/08/2022   ALBUMIN 4.9 02/08/2022   LABGLOB 2.1 02/08/2022   AGRATIO 2.3 (H) 02/08/2022   BILITOT 1.9 (H) 02/08/2022   ALKPHOS 58 02/08/2022   AST 26 02/08/2022   ALT 24 02/08/2022   ANIONGAP 11 03/08/2018   Last lipids Lab Results  Component Value Date   CHOL 158 02/08/2022   HDL 62 02/08/2022   LDLCALC 86 02/08/2022   TRIG 47 02/08/2022   CHOLHDL 2.5 02/08/2022   Last hemoglobin A1c Lab Results  Component Value Date   HGBA1C 6.0 (H) 02/08/2022   Last thyroid functions Lab Results  Component Value Date   TSH 1.170 02/08/2022        Objective     Today's Vitals   04/02/22 0913  BP: 137/79  Pulse: 62  Resp: 18  SpO2: 98%  Weight: 216 lb (98 kg)  Height: 5' 8.5" (1.74 m)  PainSc: 0-No pain   Body mass index is 32.36 kg/m.  BP Readings from Last 3 Encounters:  04/02/22 137/79  01/30/22 118/70  03/08/18 129/78    Wt Readings from Last 3 Encounters:  04/02/22 216 lb (98 kg)  01/30/22 215 lb 12.8 oz (97.9 kg)  03/08/18 220 lb (99.8 kg)     Physical Exam Vitals and nursing note reviewed.  Constitutional:      Appearance: Normal appearance. He is well-developed.  HENT:     Head: Normocephalic and atraumatic.     Right Ear: Tympanic membrane, ear canal and external ear normal.     Left Ear: Tympanic membrane, ear canal and external ear normal.     Nose: Nose normal.     Mouth/Throat:     Mouth: Mucous membranes are moist.     Pharynx: Oropharynx is clear.  Eyes:     Conjunctiva/sclera: Conjunctivae normal.     Pupils: Pupils are equal, round, and reactive to light.  Cardiovascular:     Rate and Rhythm: Normal rate and regular rhythm.     Pulses: Normal pulses.     Heart sounds: Normal heart sounds.  Pulmonary:     Effort: Pulmonary effort is normal.     Breath sounds: Normal breath sounds.   Abdominal:     General: Bowel sounds are normal. There is no distension.     Palpations: Abdomen is soft. There is no mass.     Tenderness: There is no abdominal tenderness. There is no right CVA tenderness, left CVA tenderness, guarding or rebound.     Hernia: No hernia is present.  Musculoskeletal:        General: Normal range of motion.  Cervical back: Normal range of motion and neck supple.  Lymphadenopathy:     Cervical: No cervical adenopathy.  Skin:    General: Skin is warm and dry.     Capillary Refill: Capillary refill takes less than 2 seconds.  Neurological:     General: No focal deficit present.     Mental Status: He is alert and oriented to person, place, and time.  Psychiatric:        Mood and Affect: Mood normal.        Behavior: Behavior normal.        Thought Content: Thought content normal.        Judgment: Judgment normal.      Last depression screening scores   Row Labels 04/02/2022    9:17 AM 01/30/2022   10:57 AM  PHQ 2/9 Scores   Section Header. No data exists in this row.    PHQ - 2 Score   0 0  PHQ- 9 Score   0 0   Last fall risk screening   Row Labels 04/02/2022    9:18 AM  Fall Risk    Section Header. No data exists in this row.   Falls in the past year?   0  Number falls in past yr:   0  Injury with Fall?   0    Assessment & Plan    1. Encounter for general adult medical examination with abnormal findings Annual physical today   2. Essential hypertension Stable. Continue amlodipine as prescribed   3. Mixed hyperlipidemia Lipid panel normal. Continue crestor at 10 mg daily   4. BMI 32.0-32.9,adult Discussed lowering calorie intake to 1500 calories per day and incorporating exercise into daily routine to help lose weight.  5. Screening for colon cancer New referral for colonoscipy made today  - Ambulatory referral to Gastroenterology   Immunization History  Administered Date(s) Administered   Influenza, High Dose Seasonal PF  01/09/2013   Influenza,inj,Quad PF,6+ Mos 01/30/2022   Moderna Sars-Covid-2 Vaccination 06/12/2019, 07/10/2019   PFIZER Comirnaty(Gray Top)Covid-19 Tri-Sucrose Vaccine 08/21/2020   PFIZER(Purple Top)SARS-COV-2 Vaccination 03/01/2020, 03/28/2022   Pfizer Covid-19 Vaccine Bivalent Booster 67yr & up 01/23/2021, 10/01/2021    Health Maintenance  Topic Date Due   HIV Screening  Never done   Hepatitis C Screening  Never done   DTaP/Tdap/Td (1 - Tdap) Never done   COLONOSCOPY (Pts 45-417yrInsurance coverage will need to be confirmed)  Never done   Zoster Vaccines- Shingrix (1 of 2) Never done   Pneumonia Vaccine 6563Years old (1 - PCV) Never done   Medicare Annual Wellness (AWV)  11/27/2022 (Originally 100[birth year]/27/1957  COVID-19 Vaccine (8 - 2023-24 season) 05/23/2022   INFLUENZA VACCINE  Completed   HPV VACCINES  Aged Out    Discussed health benefits of physical activity, and encouraged him to engage in regular exercise appropriate for his age and condition.  Problem List Items Addressed This Visit       Cardiovascular and Mediastinum   Essential hypertension     Other   Mixed hyperlipidemia   BMI 32.0-32.9,adult   Other Visit Diagnoses     Encounter for general adult medical examination with abnormal findings    -  Primary   Screening for colon cancer       Relevant Orders   Ambulatory referral to Gastroenterology        Return in about 6 months (around 10/02/2022) for blood pressure - medicare - welcome to medicare appointment -  needs 45 minutes .        Ronnell Freshwater, NP  [facility] at [facility] [phone] (phone) [phone] (fax)  [place]

## 2022-07-22 ENCOUNTER — Encounter: Payer: Self-pay | Admitting: Gastroenterology

## 2022-07-27 ENCOUNTER — Other Ambulatory Visit: Payer: Self-pay | Admitting: Nurse Practitioner

## 2022-07-27 DIAGNOSIS — E782 Mixed hyperlipidemia: Secondary | ICD-10-CM

## 2022-09-03 ENCOUNTER — Ambulatory Visit: Payer: Medicare HMO | Admitting: Gastroenterology

## 2022-09-03 ENCOUNTER — Encounter: Payer: Self-pay | Admitting: Gastroenterology

## 2022-09-03 VITALS — BP 136/82 | HR 69 | Ht 68.5 in | Wt 214.0 lb

## 2022-09-03 DIAGNOSIS — Z1211 Encounter for screening for malignant neoplasm of colon: Secondary | ICD-10-CM

## 2022-09-03 NOTE — Progress Notes (Signed)
     09/03/2022 Raymond Cruz [id number] 07-30-1955   HISTORY OF PRESENT ILLNESS:  This is a 67 year old male who is new to our office.  Was referred here by Vincent Gros, NP, in order to schedule a colonoscopy.  He has never had one in a past.  No GI complaints at this time.  He says that he moves his bowels regularly, no rectal bleeding.  No immediate family history of colon cancer.   Past Medical History:  Diagnosis Date   High blood pressure    High cholesterol    History reviewed. No pertinent surgical history.  reports that he has never smoked. He has never used smokeless tobacco. He reports that he does not currently use alcohol. He reports that he does not use drugs. family history includes Diabetes in his mother; Heart attack in his father; High Cholesterol in his father and mother; High blood pressure in his father and mother. No Known Allergies    Outpatient Encounter Medications as of 09/03/2022  Medication Sig   amLODipine (NORVASC) 2.5 MG tablet Take 1 tablet (2.5 mg total) by mouth daily.   cyanocobalamin (VITAMIN B12) 500 MCG tablet Take 500 mcg by mouth daily.   Omega-3 Fatty Acids (FISH OIL) 300 MG CAPS Take by mouth.   rosuvastatin (CRESTOR) 10 MG tablet TAKE 1 TABLET BY MOUTH EVERY DAY   No facility-administered encounter medications on file as of 09/03/2022.    REVIEW OF SYSTEMS  : All other systems reviewed and negative except where noted in the History of Present Illness.   PHYSICAL EXAM: BP 136/82   Pulse 69   Ht 5' 8.5" (1.74 m)   Wt 214 lb (97.1 kg)   BMI 32.07 kg/m  General: Well developed male in no acute distress Head: Normocephalic and atraumatic Eyes:  Sclerae anicteric, conjunctiva pink. Ears: Normal auditory acuity Lungs: Clear throughout to auscultation; no W/R/R. Heart: Regular rate and rhythm; no M/R/G. Rectal:  Will be done at the time of colonoscopy. Musculoskeletal: Symmetrical with no gross deformities  Skin: No lesions on  visible extremities Extremities: No edema  Neurological: Alert oriented x 4, grossly non-focal Psychological:  Alert and cooperative. Normal mood and affect  ASSESSMENT AND PLAN: *CRC screening:  Has never had a colonoscopy in the past.  Will schedule with Dr. Leone Payor.  The risks, benefits, and alternatives to colonoscopy were discussed with the patient and he consents to proceed.    CC:  Carlean Jews, NP

## 2022-09-03 NOTE — Patient Instructions (Signed)
You have been scheduled for a colonoscopy. Please follow written instructions given to you at your visit today.  Please pick up your prep supplies at the pharmacy within the next 1-3 days. If you use inhalers (even only as needed), please bring them with you on the day of your procedure.  _______________________________________________________  If your blood pressure at your visit was 140/90 or greater, please contact your primary care physician to follow up on this.  _______________________________________________________  If you are age 66 or older, your body mass index should be between 23-30. Your Body mass index is 32.07 kg/m. If this is out of the aforementioned range listed, please consider follow up with your Primary Care Provider.  If you are age 81 or younger, your body mass index should be between 19-25. Your Body mass index is 32.07 kg/m. If this is out of the aformentioned range listed, please consider follow up with your Primary Care Provider.   ________________________________________________________  The [place] [facility] providers would like to encourage you to use [facility] to communicate with providers for non-urgent requests or questions.  Due to long hold times on the telephone, sending your provider a message by [facility] may be a faster and more efficient way to get a response.  Please allow 48 business hours for a response.  Please remember that this is for non-urgent requests.  _______________________________________________________

## 2022-09-13 ENCOUNTER — Ambulatory Visit (AMBULATORY_SURGERY_CENTER): Payer: Medicare HMO | Admitting: Internal Medicine

## 2022-09-13 ENCOUNTER — Encounter: Payer: Self-pay | Admitting: Internal Medicine

## 2022-09-13 VITALS — BP 135/77 | HR 69 | Temp 98.0°F | Resp 14 | Ht 68.5 in | Wt 214.0 lb

## 2022-09-13 DIAGNOSIS — Z1211 Encounter for screening for malignant neoplasm of colon: Secondary | ICD-10-CM

## 2022-09-13 DIAGNOSIS — D122 Benign neoplasm of ascending colon: Secondary | ICD-10-CM | POA: Diagnosis not present

## 2022-09-13 MED ORDER — SODIUM CHLORIDE 0.9 % IV SOLN: 500.0000 mL | Freq: Once | INTRAVENOUS | Status: DC

## 2022-09-13 NOTE — Op Note (Signed)
[facility] Patient Name: Raymond Cruz Procedure Date: 09/13/2022 10:24 AM MRN: [id number] Endoscopist: Iva Boop , MD, [id number] Age: 67 Referring MD:  Date of Birth: 1955-07-07 Gender: Male Account #: [id number] Procedure:                Colonoscopy Indications:              Screening for colorectal malignant neoplasm, This                            is the patient's first colonoscopy Medicines:                Monitored Anesthesia Care Procedure:                Pre-Anesthesia Assessment:                           - Prior to the procedure, a History and Physical                            was performed, and patient medications and                            allergies were reviewed. The patient's tolerance of                            previous anesthesia was also reviewed. The risks                            and benefits of the procedure and the sedation                            options and risks were discussed with the patient.                            All questions were answered, and informed consent                            was obtained. Prior Anticoagulants: The patient has                            taken no anticoagulant or antiplatelet agents. ASA                            Grade Assessment: II - A patient with mild systemic                            disease. After reviewing the risks and benefits,                            the patient was deemed in satisfactory condition to                            undergo the procedure.  After obtaining informed consent, the colonoscope                            was passed under direct vision. Throughout the                            procedure, the patient's blood pressure, pulse, and                            oxygen saturations were monitored continuously. The                            CF [id number] #[id number] was introduced through the anus                            and advanced to the  the cecum, identified by                            appendiceal orifice and ileocecal valve. The                            colonoscopy was performed without difficulty. The                            patient tolerated the procedure well. The quality                            of the bowel preparation was excellent. The                            ileocecal valve, appendiceal orifice, and rectum                            were photographed. The bowel preparation used was                            Miralax via split dose instruction. Scope In: 10:33:47 AM Scope Out: 10:47:53 AM Scope Withdrawal Time: 0 hours 12 minutes 16 seconds  Total Procedure Duration: 0 hours 14 minutes 6 seconds  Findings:                 The perianal and digital rectal examinations were                            normal. Pertinent negatives include normal prostate                            (size, shape, and consistency).                           A 2 mm polyp was found in the ascending colon. The                            polyp was sessile. The polyp was removed with a  cold snare. Resection and retrieval were complete.                            Verification of patient identification for the                            specimen was done. Estimated blood loss was minimal.                           The exam was otherwise without abnormality on                            direct and retroflexion views. Complications:            No immediate complications. Estimated Blood Loss:     Estimated blood loss was minimal. Impression:               - One 2 mm polyp in the ascending colon, removed                            with a cold snare. Resected and retrieved.                           - The examination was otherwise normal on direct                            and retroflexion views. Recommendation:           - Patient has a contact number available for                            emergencies. The  signs and symptoms of potential                            delayed complications were discussed with the                            patient. Return to normal activities tomorrow.                            Written discharge instructions were provided to the                            patient.                           - Resume previous diet.                           - Continue present medications.                           - Repeat colonoscopy is recommended. The                            colonoscopy date will be determined after pathology  results from today's exam become available for                            review. Iva Boop, MD 09/13/2022 10:53:50 AM This report has been signed electronically.

## 2022-09-13 NOTE — Progress Notes (Signed)
Pt's states no medical or surgical changes since previsit or office visit. 

## 2022-09-13 NOTE — Progress Notes (Signed)
Called to room to assist during endoscopic procedure.  Patient ID and intended procedure confirmed with present staff. Received instructions for my participation in the procedure from the performing physician.  

## 2022-09-13 NOTE — Progress Notes (Signed)
Report to PACU, RN, vss, BBS= Clear.  

## 2022-09-13 NOTE — Progress Notes (Signed)
[facility] History and Physical   Primary Care Physician:  Carlean Jews, NP   Reason for Procedure:   CRCA screening  Plan:    colonoscopy     HPI: Raymond Cruz is a 67 y.o. male for screening exam   Past Medical History:  Diagnosis Date   High blood pressure    High cholesterol     History reviewed. No pertinent surgical history.  Prior to Admission medications   Medication Sig Start Date End Date Taking? Authorizing Provider  amLODipine (NORVASC) 2.5 MG tablet Take 1 tablet (2.5 mg total) by mouth daily. 01/30/22  Yes Boscia, Kathlynn Grate, NP  cyanocobalamin (VITAMIN B12) 500 MCG tablet Take 500 mcg by mouth daily.   Yes [provider]  Omega-3 Fatty Acids (FISH OIL) 300 MG CAPS Take by mouth.   Yes [provider]  rosuvastatin (CRESTOR) 10 MG tablet TAKE 1 TABLET BY MOUTH EVERY DAY 07/29/22  Yes Carlean Jews, NP    Current Outpatient Medications  Medication Sig Dispense Refill   amLODipine (NORVASC) 2.5 MG tablet Take 1 tablet (2.5 mg total) by mouth daily. 90 tablet 1   cyanocobalamin (VITAMIN B12) 500 MCG tablet Take 500 mcg by mouth daily.     Omega-3 Fatty Acids (FISH OIL) 300 MG CAPS Take by mouth.     rosuvastatin (CRESTOR) 10 MG tablet TAKE 1 TABLET BY MOUTH EVERY DAY 90 tablet 0   Current Facility-Administered Medications  Medication Dose Route Frequency Provider Last Rate Last Admin   0.9 %  sodium chloride infusion  500 mL Intravenous Once Iva Boop, MD        Allergies as of 09/13/2022   (No Known Allergies)    Family History  Problem Relation Age of Onset   Diabetes Mother    High Cholesterol Mother    High blood pressure Mother    High blood pressure Father    High Cholesterol Father    Heart attack Father    Colon cancer Neg Hx    Stomach cancer Neg Hx    Esophageal cancer Neg Hx     Social History   Socioeconomic History   Marital status: Married    Spouse name: Not on file   Number of  children: Not on file   Years of education: Not on file   Highest education level: Not on file  Occupational History   Occupation: retired  Tobacco Use   Smoking status: Never   Smokeless tobacco: Never  Vaping Use   Vaping Use: Never used  Substance and Sexual Activity   Alcohol use: Not Currently   Drug use: Never   Sexual activity: Yes    Partners: Female  Other Topics Concern   Not on file  Social History Narrative   Not on file   Social Determinants of Health   Financial Resource Strain: Not on file  Food Insecurity: Not on file  Transportation Needs: Not on file  Physical Activity: Not on file  Stress: Not on file  Social Connections: Not on file  Intimate Partner Violence: Not on file    Review of Systems:  All other review of systems negative except as mentioned in the HPI.  Physical Exam: Vital signs BP (!) 149/79   Pulse 60   Temp 98 F (36.7 C) (Skin)   Ht 5' 8.5" (1.74 m)   Wt 214 lb (97.1 kg)   SpO2 99%   BMI 32.07 kg/m   General:  Alert,  Well-developed, well-nourished, pleasant and cooperative in NAD Lungs:  Clear throughout to auscultation.   Heart:  Regular rate and rhythm; no murmurs, clicks, rubs,  or gallops. Abdomen:  Soft, nontender and nondistended. Normal bowel sounds.   Neuro/Psych:  Alert and cooperative. Normal mood and affect. A and O x 3   @Rhianon Zabawa  Sena Slate, MD, [facility] [phone] (pager) 09/13/2022 10:25 AM@

## 2022-09-13 NOTE — Patient Instructions (Addendum)
I found and removed one tiny polyp that looks benign.  All else normal.  I will let you know pathology results and when to have another routine colonoscopy by mail and/or My Chart.  I appreciate the opportunity to care for you. Iva Boop, MD, [facility]  HANDOUT ON POLYPS GIVEN TO YOU TODAY  AWAIT PATHOLOGY RESULTS ON POLYP REMOVED   RESUME USUAL DIET & MEDICATIONS    YOU HAD AN ENDOSCOPIC PROCEDURE TODAY AT THE [facility]:   Refer to the procedure report that was given to you for any specific questions about what was found during the examination.  If the procedure report does not answer your questions, please call your gastroenterologist to clarify.  If you requested that your care partner not be given the details of your procedure findings, then the procedure report has been included in a sealed envelope for you to review at your convenience later.  YOU SHOULD EXPECT: Some feelings of bloating in the abdomen. Passage of more gas than usual.  Walking can help get rid of the air that was put into your GI tract during the procedure and reduce the bloating. If you had a lower endoscopy (such as a colonoscopy or flexible sigmoidoscopy) you may notice spotting of blood in your stool or on the toilet paper. If you underwent a bowel prep for your procedure, you may not have a normal bowel movement for a few days.  Please Note:  You might notice some irritation and congestion in your nose or some drainage.  This is from the oxygen used during your procedure.  There is no need for concern and it should clear up in a day or so.  SYMPTOMS TO REPORT IMMEDIATELY:  Following lower endoscopy (colonoscopy or flexible sigmoidoscopy):  Excessive amounts of blood in the stool  Significant tenderness or worsening of abdominal pains  Swelling of the abdomen that is new, acute  Fever of 100F or higher    For urgent or emergent issues, a gastroenterologist can be reached at any hour by  calling [phone]. Do not use MyChart messaging for urgent concerns.    DIET:  We do recommend a small meal at first, but then you may proceed to your regular diet.  Drink plenty of fluids but you should avoid alcoholic beverages for 24 hours.  ACTIVITY:  You should plan to take it easy for the rest of today and you should NOT DRIVE or use heavy machinery until tomorrow (because of the sedation medicines used during the test).    FOLLOW UP: Our staff will call the number listed on your records the next business day following your procedure.  We will call around 7:15- 8:00 am to check on you and address any questions or concerns that you may have regarding the information given to you following your procedure. If we do not reach you, we will leave a message.     If any biopsies were taken you will be contacted by phone or by letter within the next 1-3 weeks.  Please call us at [phone] if you have not heard about the biopsies in 3 weeks.    SIGNATURES/CONFIDENTIALITY: You and/or your care partner have signed paperwork which will be entered into your electronic medical record.  These signatures attest to the fact that that the information above on your After Visit Summary has been reviewed and is understood.  Full responsibility of the confidentiality of this discharge information lies with you and/or your care-partner.

## 2022-09-16 ENCOUNTER — Telehealth: Payer: Self-pay

## 2022-09-16 NOTE — Telephone Encounter (Signed)
Follow up call to pt, lm for pt to call if having any difficulty with normal activities or eating and drinking.  Also to call if any other questions or concerns.  

## 2022-09-20 ENCOUNTER — Encounter: Payer: Self-pay | Admitting: Internal Medicine

## 2022-09-20 DIAGNOSIS — Z860101 Personal history of adenomatous and serrated colon polyps: Secondary | ICD-10-CM

## 2022-09-20 DIAGNOSIS — Z86010 Personal history of colonic polyps: Secondary | ICD-10-CM | POA: Insufficient documentation

## 2022-09-20 HISTORY — DX: Personal history of adenomatous and serrated colon polyps: Z86.0101

## 2022-10-03 ENCOUNTER — Encounter: Payer: Medicare HMO | Admitting: Nurse Practitioner

## 2022-10-22 ENCOUNTER — Encounter: Payer: Medicare HMO | Admitting: Nurse Practitioner

## 2022-10-24 ENCOUNTER — Ambulatory Visit (INDEPENDENT_AMBULATORY_CARE_PROVIDER_SITE_OTHER): Payer: Medicare HMO | Admitting: Family Medicine

## 2022-10-24 ENCOUNTER — Encounter: Payer: Self-pay | Admitting: Family Medicine

## 2022-10-24 VITALS — BP 154/84 | HR 58 | Temp 97.3°F | Ht 68.5 in | Wt 213.0 lb

## 2022-10-24 DIAGNOSIS — Z0000 Encounter for general adult medical examination without abnormal findings: Secondary | ICD-10-CM

## 2022-10-24 DIAGNOSIS — Z789 Other specified health status: Secondary | ICD-10-CM | POA: Diagnosis not present

## 2022-10-24 DIAGNOSIS — I10 Essential (primary) hypertension: Secondary | ICD-10-CM | POA: Diagnosis not present

## 2022-10-24 DIAGNOSIS — E782 Mixed hyperlipidemia: Secondary | ICD-10-CM | POA: Diagnosis not present

## 2022-10-24 NOTE — Progress Notes (Unsigned)
Subjective:   Raymond Cruz is a 67 y.o. male who presents for a Welcome to Medicare exam.   Patient Medicare AWV questionnaire was completed by the patient on in person; I have confirmed that all information answered by patient is correct and no changes since this date.  Review of Systems:        Objective:    There were no vitals filed for this visit. There is no height or weight on file to calculate BMI.  Medications Outpatient Encounter Medications as of 10/24/2022  Medication Sig   amLODipine (NORVASC) 2.5 MG tablet Take 1 tablet (2.5 mg total) by mouth daily.   cyanocobalamin (VITAMIN B12) 500 MCG tablet Take 500 mcg by mouth daily.   Omega-3 Fatty Acids (FISH OIL) 300 MG CAPS Take by mouth.   rosuvastatin (CRESTOR) 10 MG tablet TAKE 1 TABLET BY MOUTH EVERY DAY   No facility-administered encounter medications on file as of 10/24/2022.     History: Past Medical History:  Diagnosis Date   High blood pressure    High cholesterol    Hx of adenomatous polyp of colon 09/20/2022   No past surgical history on file.  Family History  Problem Relation Age of Onset   Diabetes Mother    High Cholesterol Mother    High blood pressure Mother    High blood pressure Father    High Cholesterol Father    Heart attack Father    Colon cancer Neg Hx    Stomach cancer Neg Hx    Esophageal cancer Neg Hx    Social History   Occupational History   Occupation: retired  Tobacco Use   Smoking status: Never   Smokeless tobacco: Never  Vaping Use   Vaping Use: Never used  Substance and Sexual Activity   Alcohol use: Not Currently   Drug use: Never   Sexual activity: Yes    Partners: Female    Tobacco Counseling Counseling given: Not Answered   Immunizations and Health Maintenance Immunization History  Administered Date(s) Administered   Influenza, High Dose Seasonal PF 01/09/2013   Influenza,inj,Quad PF,6+ Mos 01/30/2022   Moderna Sars-Covid-2 Vaccination 06/12/2019,  07/10/2019   PFIZER Comirnaty(Gray Top)Covid-19 Tri-Sucrose Vaccine 08/21/2020   PFIZER(Purple Top)SARS-COV-2 Vaccination 03/01/2020, 03/28/2022   Pfizer Covid-19 Vaccine Bivalent Booster 64yrs & up 01/23/2021, 10/01/2021   Health Maintenance Due  Topic Date Due   HIV Screening  Never done   Hepatitis C Screening  Never done   DTaP/Tdap/Td (1 - Tdap) Never done   Zoster Vaccines- Shingrix (1 of 2) Never done   COVID-19 Vaccine (8 - 2023-24 season) 05/23/2022    Activities of Daily Living    04/02/2022    9:18 AM 01/30/2022   10:57 AM  In your present state of health, do you have any difficulty performing the following activities:  Hearing? 1 1  Comment 20% loss per patient   Vision? 0 0  Difficulty concentrating or making decisions? 0 0  Walking or climbing stairs? 0 0  Dressing or bathing? 0 0  Doing errands, shopping? 0 0    Physical Exam  (optional), or other factors deemed appropriate based on the beneficiary's medical and social history and current clinical standards.  Advanced Directives:      Assessment:    This is a routine wellness  examination for this patient .   Vision/Hearing screen No results found.  Dietary issues and exercise activities discussed:      Goals   None  Depression Screen    04/02/2022    9:17 AM 01/30/2022   10:57 AM  PHQ 2/9 Scores  PHQ - 2 Score 0 0  PHQ- 9 Score 0 0     Fall Risk    04/02/2022    9:18 AM  Fall Risk   Falls in the past year? 0  Number falls in past yr: 0  Injury with Fall? 0    MEDICARE RISK AT HOME:   TIMED UP AND GO: Was the test performed? No    Cognitive Function        Patient Care Team: Sandre Kitty, MD as PCP - General (Family Medicine)  Diabetic Foot Exam: Diabetic Foot Exam: Overdue, Pt has been advised about the importance in completing this exam. Pt is scheduled for diabetic foot exam on  .  Community Resource Referral / Chronic Care Management: CRR required this visit?  No    CCM needed this visit?       Plan:     I have personally reviewed and noted the following in the patient's chart:   Medical and social history Use of alcohol, tobacco or illicit drugs  Current medications and supplements Functional ability and status Nutritional status Physical activity Advanced directives List of other physicians Hospitalizations, surgeries, and ER visits in previous 12 months Vitals Screenings to include cognitive, depression, and falls Referrals and appointments  In addition, I have reviewed and discussed with patient certain preventive protocols, quality metrics, and best practice recommendations. A written personalized care plan for preventive services as well as general preventive health recommendations were provided to patient.     Philippa Chester, CMA 10/24/2022  After Visit Summary:

## 2022-10-24 NOTE — Patient Instructions (Signed)
It was nice to see you today,  We addressed the following topics today: - continue eating good healthy food and exercising.   -200 lbs is a good goal to work towards.   - follow up with [place] in 6 months for your follow up   Have a great day,  Frederic Jericho, MD

## 2022-10-24 NOTE — Progress Notes (Unsigned)
Subjective:   Raymond Cruz is a 67 y.o. male who presents for a Welcome to Medicare exam.   Patient Medicare AWV questionnaire was completed by the patient on 10/24/22; I have confirmed that all information answered by patient is correct and no changes since this date.  Review of Systems: Negative for: headache, blurred vision, chest pain, sob, n/v/d/constipation, abdominal pain, dizziness, weakness, depression, anxiety, joint pain.  Cardiac Risk Factors include: hypertension;male gender     Objective:    Today's Vitals   10/24/22 0847  BP: (!) 154/84  Pulse: (!) 58  Temp: (!) 97.3 F (36.3 C)  TempSrc: Oral  SpO2: 99%  Weight: 213 lb (96.6 kg)  Height: 5' 8.5" (1.74 m)  PainSc: 0-No pain   Body mass index is 31.92 kg/m.  Medications Outpatient Encounter Medications as of 10/24/2022  Medication Sig   amLODipine (NORVASC) 2.5 MG tablet Take 1 tablet (2.5 mg total) by mouth daily.   cyanocobalamin (VITAMIN B12) 500 MCG tablet Take 500 mcg by mouth daily.   Omega-3 Fatty Acids (FISH OIL) 300 MG CAPS Take by mouth.   rosuvastatin (CRESTOR) 10 MG tablet TAKE 1 TABLET BY MOUTH EVERY DAY   No facility-administered encounter medications on file as of 10/24/2022.     History: Past Medical History:  Diagnosis Date   High blood pressure    High cholesterol    Hx of adenomatous polyp of colon 09/20/2022   History reviewed. No pertinent surgical history.  Family History  Problem Relation Age of Onset   Diabetes Mother    High Cholesterol Mother    High blood pressure Mother    High blood pressure Father    High Cholesterol Father    Heart attack Father    Colon cancer Neg Hx    Stomach cancer Neg Hx    Esophageal cancer Neg Hx    Social History   Occupational History   Occupation: retired  Tobacco Use   Smoking status: Never   Smokeless tobacco: Never  Vaping Use   Vaping Use: Never used  Substance and Sexual Activity   Alcohol use: Not Currently   Drug  use: Never   Sexual activity: Yes    Partners: Female    Tobacco Counseling Counseling given: Not Answered   Immunizations and Health Maintenance Immunization History  Administered Date(s) Administered   Influenza, High Dose Seasonal PF 01/09/2013   Influenza,inj,Quad PF,6+ Mos 01/30/2022   Moderna Sars-Covid-2 Vaccination 06/12/2019, 07/10/2019   PFIZER Comirnaty(Gray Top)Covid-19 Tri-Sucrose Vaccine 08/21/2020   PFIZER(Purple Top)SARS-COV-2 Vaccination 03/01/2020, 03/28/2022   Pfizer Covid-19 Vaccine Bivalent Booster 34yrs & up 01/23/2021, 10/01/2021   Health Maintenance Due  Topic Date Due   HIV Screening  Never done   Hepatitis C Screening  Never done   DTaP/Tdap/Td (1 - Tdap) Never done   Zoster Vaccines- Shingrix (1 of 2) Never done   COVID-19 Vaccine (8 - 2023-24 season) 05/23/2022    Activities of Daily Living    10/24/2022    9:03 AM 04/02/2022    9:18 AM  In your present state of health, do you have any difficulty performing the following activities:  Hearing? 1 1  Comment left ear 20% loss per patient  Vision? 0 0  Difficulty concentrating or making decisions? 0 0  Walking or climbing stairs? 0 0  Dressing or bathing? 0 0  Doing errands, shopping? 0 0  Preparing Food and eating ? N   Using the Toilet? N   In the  past six months, have you accidently leaked urine? N   Do you have problems with loss of bowel control? N   Managing your Medications? N   Managing your Finances? N   Housekeeping or managing your Housekeeping? N     Physical Exam   General: Alert and oriented.  No acute distress HEENT: PERRLA, EOMI, moist oral mucosa CV: Regular rate and rhythm, no murmurs Pulmonary: Clear bilaterally GI: Soft, normal bowel sounds MSK: Strength equal bilaterally upper and lower extremities. Neuro: Cranial nerves II through XII grossly intact Psych: Pleasant affect   Advanced Directives:  no blood products due to being jehovah's witness.   Full code.    Wife is hcpoa      Assessment:    This is a routine wellness  examination for this patient .  - pt is overall doing well.  Eats healthy, gets plenty of exercise.   - compliant with medications.   - no concerns at this time.  - pt to follow up in 6 months for routine mgmt of chronic conditions  Vision/Hearing screen No results found.  Dietary issues and exercise activities discussed:   Exercise: pt works in his garden daily.  Bought a bike and started cycling.    Diet: pt eats fresh veggies from his garden.  Eats chicken and fish such as trout, croaker, salmon.  Discussed mercury levels in seafood and which fish have higher levels and how often it is safe to eat them.        Goals   None     Depression Screen    10/24/2022    8:50 AM 10/24/2022    8:48 AM 04/02/2022    9:17 AM 01/30/2022   10:57 AM  PHQ 2/9 Scores  PHQ - 2 Score 0 0 0 0  PHQ- 9 Score 0  0 0     Fall Risk    10/24/2022    8:48 AM  Fall Risk   Falls in the past year? 0  Risk for fall due to : No Fall Risks  Follow up Falls evaluation completed    MEDICARE RISK AT HOME:   TIMED UP AND GO: Was the test performed? Yes  Length of time to ambulate 10 feet: 6 sec Gait steady and fast without use of assistive device    Cognitive Function        10/24/2022    8:48 AM  6CIT Screen  What Year? 0 points  What month? 0 points  What time? 0 points  Count back from 20 0 points  Months in reverse 2 points  Repeat phrase 0 points  Total Score 2 points    Patient Care Team: Sandre Kitty, MD as PCP - General (Family Medicine)   Community Resource Referral / Chronic Care Management: CRR required this visit?  No   CCM needed this visit?  No     Plan:     I have personally reviewed and noted the following in the patient's chart:   Medical and social history Use of alcohol, tobacco or illicit drugs  Current medications and supplements Functional ability and status Nutritional  status Physical activity Advanced directives List of other physicians Hospitalizations, surgeries, and ER visits in previous 12 months Vitals Screenings to include cognitive, depression, and falls Referrals and appointments  In addition, I have reviewed and discussed with patient certain preventive protocols, quality metrics, and best practice recommendations. A written personalized care plan for preventive services as well as general  preventive health recommendations were provided to patient.     Sandre Kitty, MD 10/24/2022  After Visit Summary: (Pick Up) Due to this being a telephonic visit, with patients personalized plan was offered to patient and patient has requested to Pick up at office.

## 2022-10-28 NOTE — Progress Notes (Unsigned)
Annual Wellness Visit     Patient: Raymond Cruz, Male    DOB: October 24, 1955, 67 y.o.   MRN: [id number]  Subjective  Chief Complaint  Patient presents with   Medicare Wellness    Raymond Cruz is a 67 y.o. male who presents today for his Annual Wellness Visit. He reports consuming a general diet. Home exercise routine includes  . He generally feels well. He reports sleeping well. He does not have additional problems to discuss today.   HPI  {VISON DENTAL STD PSA (Optional):27386}  {History (Optional):23778}  Medications: Outpatient Medications Prior to Visit  Medication Sig   amLODipine (NORVASC) 2.5 MG tablet Take 1 tablet (2.5 mg total) by mouth daily.   cyanocobalamin (VITAMIN B12) 500 MCG tablet Take 500 mcg by mouth daily.   Omega-3 Fatty Acids (FISH OIL) 300 MG CAPS Take by mouth.   rosuvastatin (CRESTOR) 10 MG tablet TAKE 1 TABLET BY MOUTH EVERY DAY   No facility-administered medications prior to visit.    No Known Allergies  Patient Care Team: Sandre Kitty, MD as PCP - General (Family Medicine)  ROS      Objective  BP (!) 154/84   Pulse (!) 58   Temp (!) 97.3 F (36.3 C) (Oral)   Ht 5' 8.5" (1.74 m)   Wt 213 lb (96.6 kg)   SpO2 99%   BMI 31.92 kg/m  {Vitals History (Optional):23777}  Physical Exam    Most recent functional status assessment:    10/24/2022    9:03 AM  In your present state of health, do you have any difficulty performing the following activities:  Hearing? 1  Comment left ear  Vision? 0  Difficulty concentrating or making decisions? 0  Walking or climbing stairs? 0  Dressing or bathing? 0  Doing errands, shopping? 0  Preparing Food and eating ? N  Using the Toilet? N  In the past six months, have you accidently leaked urine? N  Do you have problems with loss of bowel control? N  Managing your Medications? N  Managing your Finances? N  Housekeeping or managing your Housekeeping? N   Most recent fall risk  assessment:    10/24/2022    8:48 AM  Fall Risk   Falls in the past year? 0  Risk for fall due to : No Fall Risks  Follow up Falls evaluation completed    Most recent depression screenings:    10/24/2022    8:50 AM 10/24/2022    8:48 AM  PHQ 2/9 Scores  PHQ - 2 Score 0 0  PHQ- 9 Score 0    Most recent cognitive screening:    10/24/2022    8:48 AM  6CIT Screen  What Year? 0 points  What month? 0 points  What time? 0 points  Count back from 20 0 points  Months in reverse 2 points  Repeat phrase 0 points  Total Score 2 points   Most recent Audit-C alcohol use screening    10/24/2022    9:06 AM  Alcohol Use Disorder Test (AUDIT)  1. How often do you have a drink containing alcohol? 1  2. How many drinks containing alcohol do you have on a typical day when you are drinking? 0  3. How often do you have six or more drinks on one occasion? 1  AUDIT-C Score 2   A score of 3 or more in women, and 4 or more in men indicates increased risk for alcohol abuse, EXCEPT if  all of the points are from question 1   Vision/Hearing Screen: No results found.  {Labs (Optional):23779}  No results found for any visits on 10/24/22.    Assessment & Plan   Annual wellness visit done today including the all of the following: Reviewed patient's Family Medical History Reviewed and updated list of patient's medical providers Assessment of cognitive impairment was done Assessed patient's functional ability Established a written schedule for health screening services Health Risk Assessent Completed and Reviewed  Exercise Activities and Dietary recommendations  Goals   None     Immunization History  Administered Date(s) Administered   Influenza, High Dose Seasonal PF 01/09/2013   Influenza,inj,Quad PF,6+ Mos 01/30/2022   Moderna Sars-Covid-2 Vaccination 06/12/2019, 07/10/2019   PFIZER Comirnaty(Gray Top)Covid-19 Tri-Sucrose Vaccine 08/21/2020   PFIZER(Purple Top)SARS-COV-2 Vaccination  03/01/2020, 03/28/2022   Pfizer Covid-19 Vaccine Bivalent Booster 38yrs & up 01/23/2021, 10/01/2021    Health Maintenance  Topic Date Due   HIV Screening  Never done   Hepatitis C Screening  Never done   DTaP/Tdap/Td (1 - Tdap) Never done   Zoster Vaccines- Shingrix (1 of 2) Never done   COVID-19 Vaccine (8 - 2023-24 season) 05/23/2022   Pneumonia Vaccine 49+ Years old (1 of 1 - PCV) 04/03/2023 (Originally 02/10/2022)   INFLUENZA VACCINE  11/28/2022   Medicare Annual Wellness (AWV)  10/24/2023   Colonoscopy  09/12/2032   HPV VACCINES  Aged Out     Discussed health benefits of physical activity, and encouraged him to engage in regular exercise appropriate for his age and condition.    Problem List Items Addressed This Visit       Cardiovascular and Mediastinum   Essential hypertension     Other   Mixed hyperlipidemia   Relevant Orders   Lipid Profile   HgB A1c   No blood products   Other Visit Diagnoses     Encounter for Medicare annual wellness exam    -  Primary   Welcome to Medicare preventive visit       Relevant Orders   EKG 12-Lead (Completed)       Return in about 6 months (around 04/25/2023) for HTN, hld.     Raymond Cruz, CMA

## 2022-11-10 ENCOUNTER — Other Ambulatory Visit: Payer: Self-pay | Admitting: Nurse Practitioner

## 2022-11-10 DIAGNOSIS — I10 Essential (primary) hypertension: Secondary | ICD-10-CM

## 2022-11-11 ENCOUNTER — Other Ambulatory Visit: Payer: Self-pay | Admitting: Nurse Practitioner

## 2022-11-11 DIAGNOSIS — E782 Mixed hyperlipidemia: Secondary | ICD-10-CM

## 2023-05-06 ENCOUNTER — Other Ambulatory Visit: Payer: Self-pay | Admitting: Family Medicine

## 2023-05-06 DIAGNOSIS — E782 Mixed hyperlipidemia: Secondary | ICD-10-CM

## 2023-05-22 ENCOUNTER — Other Ambulatory Visit: Payer: Self-pay | Admitting: Family Medicine

## 2023-05-22 DIAGNOSIS — I10 Essential (primary) hypertension: Secondary | ICD-10-CM

## 2023-05-23 ENCOUNTER — Encounter: Payer: Self-pay | Admitting: Family Medicine

## 2023-05-23 ENCOUNTER — Ambulatory Visit (INDEPENDENT_AMBULATORY_CARE_PROVIDER_SITE_OTHER): Payer: Medicare HMO | Admitting: Family Medicine

## 2023-05-23 VITALS — BP 131/67 | HR 82 | Ht 68.5 in | Wt 222.0 lb

## 2023-05-23 DIAGNOSIS — I10 Essential (primary) hypertension: Secondary | ICD-10-CM

## 2023-05-23 DIAGNOSIS — E782 Mixed hyperlipidemia: Secondary | ICD-10-CM | POA: Diagnosis not present

## 2023-05-23 DIAGNOSIS — N529 Male erectile dysfunction, unspecified: Secondary | ICD-10-CM | POA: Diagnosis not present

## 2023-05-23 DIAGNOSIS — R7303 Prediabetes: Secondary | ICD-10-CM | POA: Insufficient documentation

## 2023-05-23 LAB — POCT GLYCOSYLATED HEMOGLOBIN (HGB A1C): HbA1c POC (<> result, manual entry): 6.1 % (ref 4.0–5.6)

## 2023-05-23 MED ORDER — TADALAFIL 5 MG PO TABS: 5.0000 mg | ORAL_TABLET | Freq: Every day | ORAL | 11 refills | Status: DC

## 2023-05-23 MED ORDER — AMLODIPINE BESYLATE 5 MG PO TABS: 5.0000 mg | ORAL_TABLET | Freq: Every day | ORAL | 1 refills | Status: DC

## 2023-05-23 NOTE — Assessment & Plan Note (Signed)
On amlodipine 2.5 mg.  Increasing to 5 mg.  Follow-up in 1 month.

## 2023-05-23 NOTE — Assessment & Plan Note (Signed)
Stable at 6.1.  Continue to encourage healthy eating.  Recheck yearly.

## 2023-05-23 NOTE — Patient Instructions (Signed)
It was nice to see you today,  We addressed the following topics today: -I am sending in a medication called tadalafil for you to take daily for your erectile dysfunction.  If this is not helping let me know and we can change the medication or increase the dose. - I am increasing your amlodipine to 5 mg - Please check your blood pressure daily for at least 2 weeks and send the results back to [place].  Have a great day,  Frederic Jericho, MD

## 2023-05-23 NOTE — Assessment & Plan Note (Signed)
Patient is taking his Crestor.  No concerns or questions.  Continue current dose.  Checking lipid panel today.

## 2023-05-23 NOTE — Assessment & Plan Note (Signed)
Issues with erections going back for the last 3 months.  Discussed medication options.  Patient okay with daily tadalafil.

## 2023-05-23 NOTE — Progress Notes (Signed)
   Established Patient Office Visit  Subjective   Patient ID: Raymond Cruz, male    DOB: [birth year]  Age: 68 y.o. MRN: [id number]  Chief Complaint  Patient presents with   Medical Management of Chronic Issues    HPI  Hypertension-patient is taking his amlodipine.  Does not usually skip doses.  Patient takes his Crestor 10 mg.  No questions or concerns.  Tolerating it well.  patient tries eat healthy, has his own garden where he grows his own vegetables.  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  He exercises on a stationary bike, does sit ups and other body weight exercises.  Patient has concerns about erectile dysfunction.  Has never been on medication for ED.  Has noticed this has been an issue going back for the past 3 to 4 months.  Does not occur every time but sometimes has difficulty achieving and maintaining erection.   The 10-year ASCVD risk score (Arnett DK, et al., 2019) is: 15.3%  Health Maintenance Due  Topic Date Due   Hepatitis C Screening  Never done   DTaP/Tdap/Td (1 - Tdap) Never done      Objective:     BP 131/67   Pulse 82   Ht 5' 8.5" (1.74 m)   Wt 222 lb (100.7 kg)   SpO2 99%   BMI 33.26 kg/m    Physical Exam General: Alert, oriented CV: Regular rate rhythm no murmurs Pulmonary: Lungs clear bilaterally Psych: Pleasant affect   Results for orders placed or performed in visit on 05/23/23  POCT HgB A1C  Result Value Ref Range   Hemoglobin A1C     HbA1c POC (<> result, manual entry) 6.1 4.0 - 5.6 %   HbA1c, POC (prediabetic range)     HbA1c, POC (controlled diabetic range)          Assessment & Plan:   Mixed hyperlipidemia Assessment & Plan: Patient is taking his Crestor.  No concerns or questions.  Continue current dose.  Checking lipid panel today.  Orders: -     Lipid panel -     Comprehensive metabolic panel  Essential hypertension Assessment & Plan: On amlodipine 2.5 mg.  Increasing to 5  mg.  Follow-up in 1 month.  Orders: -     amLODIPine Besylate; Take 1 tablet (5 mg total) by mouth daily.  Dispense: 90 tablet; Refill: 1 -     Comprehensive metabolic panel  Prediabetes Assessment & Plan: Stable at 6.1.  Continue to encourage healthy eating.  Recheck yearly.  Orders: -     POCT glycosylated hemoglobin (Hb A1C)  Vasculogenic erectile dysfunction, unspecified vasculogenic erectile dysfunction type Assessment & Plan: Issues with erections going back for the last 3 months.  Discussed medication options.  Patient okay with daily tadalafil.   Other orders -     Tadalafil; Take 1 tablet (5 mg total) by mouth daily.  Dispense: 30 tablet; Refill: 11     Return in about 4 weeks (around 06/20/2023) for HTN.    Sandre Kitty, MD

## 2023-05-24 LAB — COMPREHENSIVE METABOLIC PANEL
ALT: 19 [IU]/L (ref 0–44)
AST: 20 [IU]/L (ref 0–40)
Albumin: 4.5 g/dL (ref 3.9–4.9)
Alkaline Phosphatase: 57 [IU]/L (ref 44–121)
BUN/Creatinine Ratio: 14 (ref 10–24)
BUN: 15 mg/dL (ref 8–27)
Bilirubin Total: 1.1 mg/dL (ref 0.0–1.2)
CO2: 22 mmol/L (ref 20–29)
Calcium: 9.4 mg/dL (ref 8.6–10.2)
Chloride: 105 mmol/L (ref 96–106)
Creatinine, Ser: 1.06 mg/dL (ref 0.76–1.27)
Globulin, Total: 2.3 g/dL (ref 1.5–4.5)
Glucose: 154 mg/dL — ABNORMAL HIGH (ref 70–99)
Potassium: 4.3 mmol/L (ref 3.5–5.2)
Sodium: 142 mmol/L (ref 134–144)
Total Protein: 6.8 g/dL (ref 6.0–8.5)
eGFR: 77 mL/min/{1.73_m2} (ref >59)

## 2023-05-24 LAB — LIPID PANEL
Chol/HDL Ratio: 4.9 {ratio} (ref 0.0–5.0)
Cholesterol, Total: 220 mg/dL — ABNORMAL HIGH (ref 100–199)
HDL: 45 mg/dL (ref >39)
LDL Chol Calc (NIH): 131 mg/dL — ABNORMAL HIGH (ref 0–99)
Triglycerides: 247 mg/dL — ABNORMAL HIGH (ref 0–149)
VLDL Cholesterol Cal: 44 mg/dL — ABNORMAL HIGH (ref 5–40)

## 2023-05-26 ENCOUNTER — Telehealth: Payer: Self-pay

## 2023-05-26 ENCOUNTER — Other Ambulatory Visit: Payer: Self-pay | Admitting: Family Medicine

## 2023-05-26 DIAGNOSIS — E782 Mixed hyperlipidemia: Secondary | ICD-10-CM

## 2023-05-26 MED ORDER — ROSUVASTATIN CALCIUM 20 MG PO TABS: 20.0000 mg | ORAL_TABLET | Freq: Every day | ORAL | 1 refills | Status: DC

## 2023-05-26 NOTE — Telephone Encounter (Signed)
Copied from CRM [id number]. Topic: Clinical - Prescription Issue >> May 26, 2023  1:17 PM Fuller Mandril wrote: Reason for CRM: Patient called stated he called [facility] multiple times and only gets voice mail. Recording states e does not have anything to be picked states. Trying to see why medication is on hold or check the status of filling prescription. Will keep trying pharmacy but would like to know if there is an error or hold or anything the provider shows on this side. Thank You

## 2023-05-26 NOTE — Telephone Encounter (Signed)
The prescription was sent in.  He may need to get them some more time.  Did he say which [facility] he called?  It was sent to [address]

## 2023-05-27 NOTE — Telephone Encounter (Signed)
Called pt LVM to contact the office

## 2023-05-28 NOTE — Telephone Encounter (Signed)
Called pt he stated that he was able to get his medication

## 2023-05-30 ENCOUNTER — Other Ambulatory Visit: Payer: Self-pay | Admitting: Family Medicine

## 2023-06-27 ENCOUNTER — Encounter: Payer: Self-pay | Admitting: Family Medicine

## 2023-06-27 ENCOUNTER — Ambulatory Visit (INDEPENDENT_AMBULATORY_CARE_PROVIDER_SITE_OTHER): Payer: Medicare HMO | Admitting: Family Medicine

## 2023-06-27 VITALS — BP 137/84 | HR 62 | Ht 68.5 in | Wt 219.2 lb

## 2023-06-27 DIAGNOSIS — I10 Essential (primary) hypertension: Secondary | ICD-10-CM | POA: Diagnosis not present

## 2023-06-27 DIAGNOSIS — N529 Male erectile dysfunction, unspecified: Secondary | ICD-10-CM

## 2023-06-27 DIAGNOSIS — E782 Mixed hyperlipidemia: Secondary | ICD-10-CM | POA: Diagnosis not present

## 2023-06-27 NOTE — Assessment & Plan Note (Signed)
 Pressure initially elevated today, better on repeat.  Continue with amlodipine 5 mg.  Gave patient a home blood pressure log and advised him to check this daily and bring it back to [place] in the next 2 weeks.

## 2023-06-27 NOTE — Progress Notes (Signed)
   Established Patient Office Visit  Subjective   Patient ID: Raymond Cruz, male    DOB: 1956/03/04  Age: 68 y.o. MRN: [id number]  Chief Complaint  Patient presents with   Medical Management of Chronic Issues    HPI  Hypertension-5 mg of amlodipine.  He has not taken it yet this morning.  Patient checks his blood pressure at home but they think the blood pressure cuff is inaccurate so they are getting a new one.  Patient satisfied with his tadalafil.  Takes it as needed and not daily.  Takes at 2 tablets at a time.  Cholesterol-patient is currently finishing up the end of his old dose before picking up the new dose of Crestor.  We discussed increasing the old dose by taking 2 of them a day.  Patient plans to be more active in the spring with gardening, bike riding and he recently built a basketball court.   The 10-year ASCVD risk score (Arnett DK, et al., 2019) is: 19.8%  Health Maintenance Due  Topic Date Due   Hepatitis C Screening  Never done   DTaP/Tdap/Td (1 - Tdap) Never done      Objective:     BP 137/84   Pulse 62   Ht 5' 8.5" (1.74 m)   Wt 219 lb 3.2 oz (99.4 kg)   SpO2 97%   BMI 32.84 kg/m    Physical Exam General: Alert, oriented CV: Regular rate rhythm Pulmonary: Lungs clear bilaterally Psych: Pleasant affect   No results found for any visits on 06/27/23.      Assessment & Plan:   Essential hypertension Assessment & Plan: Pressure initially elevated today, better on repeat.  Continue with amlodipine 5 mg.  Gave patient a home blood pressure log and advised him to check this daily and bring it back to [place] in the next 2 weeks.   Mixed hyperlipidemia Assessment & Plan: Taking 10 mg Crestor.  Advised him he can double this dose until he runs out and then pick up the new prescription of 20 mg.  Recheck in approximately 6 to 8 weeks.   Vasculogenic erectile dysfunction, unspecified vasculogenic erectile dysfunction type Assessment & Plan: No  issues with affording the medicine.  Takes it as needed instead of daily and takes 2 tablets at a time.  Continue current dose tadalafil 5 mg.      Return in about 4 months (around 10/25/2023) for HTN, hld.    Sandre Kitty, MD

## 2023-06-27 NOTE — Assessment & Plan Note (Signed)
 Taking 10 mg Crestor.  Advised him he can double this dose until he runs out and then pick up the new prescription of 20 mg.  Recheck in approximately 6 to 8 weeks.

## 2023-06-27 NOTE — Assessment & Plan Note (Signed)
 No issues with affording the medicine.  Takes it as needed instead of daily and takes 2 tablets at a time.  Continue current dose tadalafil 5 mg.

## 2023-06-27 NOTE — Patient Instructions (Signed)
 It was nice to see you today,  We addressed the following topics today: -For your blood pressure it was slightly elevated today.  I would like you to check your blood pressure at home for the next few weeks and document on the piece of paper I gave you and you can bring it and to our office so that I can review it. - Your cholesterol your new dose of medicine should be 20 mg which is twice the dose of previous medication.  If you are still taking the old 10 mg doses you can take 2 of them a day until you run out and then you can start taking the 20 mg tablets   Have a great day,  Frederic Jericho, MD

## 2023-11-04 ENCOUNTER — Encounter: Payer: Self-pay | Admitting: Family Medicine

## 2023-11-04 ENCOUNTER — Ambulatory Visit (INDEPENDENT_AMBULATORY_CARE_PROVIDER_SITE_OTHER): Admitting: Family Medicine

## 2023-11-04 VITALS — BP 135/73 | HR 60 | Ht 68.5 in | Wt 218.0 lb

## 2023-11-04 DIAGNOSIS — E782 Mixed hyperlipidemia: Secondary | ICD-10-CM

## 2023-11-04 DIAGNOSIS — H9190 Unspecified hearing loss, unspecified ear: Secondary | ICD-10-CM | POA: Insufficient documentation

## 2023-11-04 DIAGNOSIS — H9193 Unspecified hearing loss, bilateral: Secondary | ICD-10-CM

## 2023-11-04 NOTE — Patient Instructions (Signed)
 It was nice to see you today,  We addressed the following topics today: -Your ears are clean.  There is no wax in them.  If you are having issues with worsening hearing you should get evaluated for hearing aids. - I would like to check your cholesterol level again.  Has been 6 months since last time it was checked and we increased the dose.   Have a great day,  Rolan Slain, MD

## 2023-11-04 NOTE — Assessment & Plan Note (Signed)
 Presents for ear cleaning, concerned about possible cerumen impaction causing hearing loss. Examination reveals clean ear canals with no significant wax buildup. Condition is likely related to presbycusis or other underlying cause, not cerumen. Discussed that if hearing is worsening, an evaluation for hearing aids is recommended. - Continue to monitor. - Advised to schedule audiology evaluation if hearing worsens. - Reassured that current ear cleaning routine is adequate.

## 2023-11-04 NOTE — Progress Notes (Signed)
   Established Patient Office Visit  Subjective   Patient ID: Raymond Cruz, male    DOB: 16-Oct-1955  Age: 68 y.o. MRN: [id number]  Chief Complaint  Patient presents with   Ear Fullness    HPI  subjective - Presents for cerumen removal. Reports cleaning ears at home with a bulb syringe and peroxide/water mixture approximately once a month due to working outside in dusty conditions. - States hearing may be getting worse. - Denies any other concerns.  Medications Crestor  20 mg daily, amlodipine . Not taking tadalafil  prescribed in January.  PMH, PSH, FH, Social Hx History of hyperlipidemia. Reports being active, gardening, trying to get back to biking and walking more. Has a basketball court.  ROS Constitutional: Denies issues. Ears: Reports possible decreased hearing. Denies cerumen impaction symptoms.  The 10-year ASCVD risk score (Arnett DK, et al., 2019) is: 22.1%  Health Maintenance Due  Topic Date Due   Hepatitis C Screening  Never done   DTaP/Tdap/Td (1 - Tdap) Never done   Zoster Vaccines- Shingrix (1 of 2) Never done   COVID-19 Vaccine (9 - 2024-25 season) 06/30/2023   Medicare Annual Wellness (AWV)  10/24/2023      Objective:     BP (!) 146/74   Pulse 60   Ht 5' 8.5 (1.74 m)   Wt 218 lb (98.9 kg)   SpO2 98%   BMI 32.66 kg/m    Physical Exam General: Appears well. HEENT: Ear canals are clean, without significant cerumen bilaterally. TMs visualized and clear.   No results found for any visits on 11/04/23.      Assessment & Plan:   Mixed hyperlipidemia Assessment & Plan: History of hyperlipidemia, on Crestor  20 mg daily which was increased from 10 mg at last visit approximately six months ago. - Schedule lab visit for lipid panel to assess efficacy of dose increase. - Follow up in 6 months to review results and for routine check.  Orders: -     Lipid panel; Future  Bilateral hearing loss, unspecified hearing loss type Assessment &  Plan: Presents for ear cleaning, concerned about possible cerumen impaction causing hearing loss. Examination reveals clean ear canals with no significant wax buildup. Condition is likely related to presbycusis or other underlying cause, not cerumen. Discussed that if hearing is worsening, an evaluation for hearing aids is recommended. - Continue to monitor. - Advised to schedule audiology evaluation if hearing worsens. - Reassured that current ear cleaning routine is adequate.      Return in about 6 months (around 05/06/2024) for HTN, hld.    [name], MD

## 2023-11-04 NOTE — Assessment & Plan Note (Signed)
 History of hyperlipidemia, on Crestor  20 mg daily which was increased from 10 mg at last visit approximately six months ago. - Schedule lab visit for lipid panel to assess efficacy of dose increase. - Follow up in 6 months to review results and for routine check.

## 2023-11-18 ENCOUNTER — Other Ambulatory Visit

## 2023-11-18 DIAGNOSIS — E782 Mixed hyperlipidemia: Secondary | ICD-10-CM

## 2023-11-19 ENCOUNTER — Telehealth: Payer: Self-pay

## 2023-11-19 ENCOUNTER — Ambulatory Visit: Payer: Self-pay | Admitting: Family Medicine

## 2023-11-19 LAB — LIPID PANEL
Chol/HDL Ratio: 2.5 ratio (ref 0.0–5.0)
Cholesterol, Total: 125 mg/dL (ref 100–199)
HDL: 50 mg/dL (ref >39)
LDL Chol Calc (NIH): 60 mg/dL (ref 0–99)
Triglycerides: 73 mg/dL (ref 0–149)
VLDL Cholesterol Cal: 15 mg/dL (ref 5–40)

## 2023-11-19 NOTE — Telephone Encounter (Signed)
 Copied from CRM [id number]. Topic: Clinical - Lab/Test Results >> Nov 19, 2023  9:47 AM Ivette P wrote: Reason for CRM: Pt called in about his Labs from 07/22. Pt would like his labs mailed to him, pt has a hard time logging into mychart and prefers a hard copy instead.   Im sending his labs to him today.

## 2023-12-07 ENCOUNTER — Other Ambulatory Visit: Payer: Self-pay | Admitting: Family Medicine

## 2023-12-07 DIAGNOSIS — E782 Mixed hyperlipidemia: Secondary | ICD-10-CM

## 2023-12-11 ENCOUNTER — Other Ambulatory Visit: Payer: Self-pay | Admitting: Family Medicine

## 2023-12-11 DIAGNOSIS — I10 Essential (primary) hypertension: Secondary | ICD-10-CM

## 2024-04-20 NOTE — Progress Notes (Signed)
 Pharmacy Quality Measure Review  This patient is appearing on a report for being at risk of failing the adherence measure for cholesterol (statin) medications this calendar year.   Medication: Rosuvastatin  20 mg Last fill date: 04/06/24 for 90 day supply, sold 04/11/24  Insurance report was not up to date. No action needed at this time.   Jenkins Graces, PharmD PGY1 Pharmacy Resident

## 2024-05-06 ENCOUNTER — Ambulatory Visit (INDEPENDENT_AMBULATORY_CARE_PROVIDER_SITE_OTHER): Admitting: Family Medicine

## 2024-05-06 ENCOUNTER — Encounter: Payer: Self-pay | Admitting: Family Medicine

## 2024-05-06 VITALS — BP 137/83 | HR 61 | Ht 68.5 in | Wt 220.0 lb

## 2024-05-06 DIAGNOSIS — I10 Essential (primary) hypertension: Secondary | ICD-10-CM

## 2024-05-06 DIAGNOSIS — E782 Mixed hyperlipidemia: Secondary | ICD-10-CM | POA: Diagnosis not present

## 2024-05-06 DIAGNOSIS — Z125 Encounter for screening for malignant neoplasm of prostate: Secondary | ICD-10-CM

## 2024-05-06 DIAGNOSIS — J029 Acute pharyngitis, unspecified: Secondary | ICD-10-CM | POA: Insufficient documentation

## 2024-05-06 DIAGNOSIS — R7303 Prediabetes: Secondary | ICD-10-CM

## 2024-05-06 DIAGNOSIS — N529 Male erectile dysfunction, unspecified: Secondary | ICD-10-CM | POA: Diagnosis not present

## 2024-05-06 DIAGNOSIS — Z0000 Encounter for general adult medical examination without abnormal findings: Secondary | ICD-10-CM

## 2024-05-06 DIAGNOSIS — Z6832 Body mass index (BMI) 32.0-32.9, adult: Secondary | ICD-10-CM

## 2024-05-06 MED ORDER — TADALAFIL 10 MG PO TABS: 10.0000 mg | ORAL_TABLET | Freq: Every day | ORAL | 5 refills | Status: AC | PRN

## 2024-05-06 NOTE — Assessment & Plan Note (Signed)
 Continue crestor  20mg .  Getting lipid panel today

## 2024-05-06 NOTE — Progress Notes (Signed)
 "  Established Patient Office Visit  Subjective   Patient ID: Raymond Cruz, male    DOB: 13-Apr-1956  Age: 69 y.o. MRN: [id number]  Chief Complaint  Patient presents with   Medical Management of Chronic Issues     History of Present Illness   Raymond Cruz is a 69 year old male who presents for htn and hld f/u and complains of a sore throat after eating.  He experiences a sore throat after eating on one occasion, describing the pain as excruciating and lasting for about three to four minutes before subsiding. No choking.  The food was soup and a grilled cheese.. A similar episode occurred approximately six to eight months ago.   He takes tadalafil  as needed, and is requesting an increase in dose from 5mg  to 10mg . No issues with his current medication regimen.  He remains active, engaging in activities such as riding his bicycle, maintaining a basketball court in his backyard, gardening, and planning to restore antique tractors.          The ASCVD Risk score (Arnett DK, et al., 2019) failed to calculate for the following reasons:   The valid total cholesterol range is 130 to 320 mg/dL  Health Maintenance Due  Topic Date Due   Hepatitis C Screening  Never done   DTaP/Tdap/Td (1 - Tdap) Never done   Zoster Vaccines- Shingrix (1 of 2) Never done      Objective:     BP 137/83   Pulse 61   Ht 5' 8.5 (1.74 m)   Wt 220 lb (99.8 kg)   SpO2 98%   BMI 32.96 kg/m    Physical Exam     Gen: alert, oriented HEENT: perrla, eomi, mmm. Normal oropharynx.  Neck: normal neck exam, no mass or enlargement CV: rrr, no murmur Pulm: lctab. No wheeze or crackles.  GI: soft, nbs.  Nontender to palpation MSK: strength equal b/l. Normal gait Ext: no pedal edema Skin: warm and dry, no rashes Psych: pleasant affect.  Spontaneous speech       No results found for any visits on 05/06/24.      Assessment & Plan:   Physical exam, annual  Prostate cancer screening -      PSA  Essential hypertension Assessment & Plan: Continue amlodipine  5mg .   Orders: -     Comprehensive metabolic panel with GFR -     CBC with Differential/Platelet  BMI 32.0-32.9,adult -     Comprehensive metabolic panel with GFR -     CBC with Differential/Platelet -     Lipid panel -     Hemoglobin A1c  Mixed hyperlipidemia Assessment & Plan: Continue crestor  20mg .  Getting lipid panel today  Orders: -     Comprehensive metabolic panel with GFR -     Lipid panel  Prediabetes -     Hemoglobin A1c  Throat soreness Assessment & Plan: Intermittent sore throat with pain post-eating, suggestive of viral etiology.   Vasculogenic erectile dysfunction, unspecified vasculogenic erectile dysfunction type Assessment & Plan: Managed with tadalafil  10 mg as needed due to delayed response at lower dose. - Prescribed tadalafil  10 mg as needed.   Other orders -     Tadalafil ; Take 1 tablet (10 mg total) by mouth daily as needed for erectile dysfunction.  Dispense: 20 tablet; Refill: 5          Return in about 6 months (around 11/03/2024) for hld, HTN.    [name], MD  "

## 2024-05-06 NOTE — Patient Instructions (Signed)
 It was nice to see you today,  We addressed the following topics today: - check your blood pressure at home twice a day for two weeks and bring the results back to us  so we can evalutae them.  - I have sent in an increased dose of the tadalafil   Have a great day,  Rolan Slain, MD

## 2024-05-06 NOTE — Assessment & Plan Note (Signed)
 Continue amlodipine  5 mg

## 2024-05-06 NOTE — Assessment & Plan Note (Signed)
 Intermittent sore throat with pain post-eating, suggestive of viral etiology.

## 2024-05-06 NOTE — Assessment & Plan Note (Signed)
 Managed with tadalafil  10 mg as needed due to delayed response at lower dose. - Prescribed tadalafil  10 mg as needed.

## 2024-05-07 LAB — COMPREHENSIVE METABOLIC PANEL WITH GFR
ALT: 15 IU/L (ref 0–44)
AST: 18 IU/L (ref 0–40)
Albumin: 4.6 g/dL (ref 3.9–4.9)
Alkaline Phosphatase: 57 IU/L (ref 47–123)
BUN/Creatinine Ratio: 15 (ref 10–24)
BUN: 15 mg/dL (ref 8–27)
Bilirubin Total: 1.2 mg/dL (ref 0.0–1.2)
CO2: 21 mmol/L (ref 20–29)
Calcium: 9.5 mg/dL (ref 8.6–10.2)
Chloride: 104 mmol/L (ref 96–106)
Creatinine, Ser: 0.97 mg/dL (ref 0.76–1.27)
Globulin, Total: 2.3 g/dL (ref 1.5–4.5)
Glucose: 90 mg/dL (ref 70–99)
Potassium: 4.4 mmol/L (ref 3.5–5.2)
Sodium: 141 mmol/L (ref 134–144)
Total Protein: 6.9 g/dL (ref 6.0–8.5)
eGFR: 86 mL/min/1.73

## 2024-05-07 LAB — LIPID PANEL
Chol/HDL Ratio: 2.8 ratio (ref 0.0–5.0)
Cholesterol, Total: 133 mg/dL (ref 100–199)
HDL: 48 mg/dL
LDL Chol Calc (NIH): 64 mg/dL (ref 0–99)
Triglycerides: 117 mg/dL (ref 0–149)
VLDL Cholesterol Cal: 21 mg/dL (ref 5–40)

## 2024-05-07 LAB — CBC WITH DIFFERENTIAL/PLATELET
Basophils Absolute: 0 x10E3/uL (ref 0.0–0.2)
Basos: 1 %
EOS (ABSOLUTE): 0.2 x10E3/uL (ref 0.0–0.4)
Eos: 4 %
Hematocrit: 48.6 % (ref 37.5–51.0)
Hemoglobin: 16.3 g/dL (ref 13.0–17.7)
Immature Grans (Abs): 0 x10E3/uL (ref 0.0–0.1)
Immature Granulocytes: 0 %
Lymphocytes Absolute: 2.1 x10E3/uL (ref 0.7–3.1)
Lymphs: 33 %
MCH: 30.5 pg (ref 26.6–33.0)
MCHC: 33.5 g/dL (ref 31.5–35.7)
MCV: 91 fL (ref 79–97)
Monocytes Absolute: 0.5 x10E3/uL (ref 0.1–0.9)
Monocytes: 7 %
Neutrophils Absolute: 3.4 x10E3/uL (ref 1.4–7.0)
Neutrophils: 55 %
Platelets: 227 x10E3/uL (ref 150–450)
RBC: 5.35 x10E6/uL (ref 4.14–5.80)
RDW: 12.4 % (ref 11.6–15.4)
WBC: 6.2 x10E3/uL (ref 3.4–10.8)

## 2024-05-07 LAB — PSA: Prostate Specific Ag, Serum: 0.4 ng/mL (ref 0.0–4.0)

## 2024-05-07 LAB — HEMOGLOBIN A1C
Est. average glucose Bld gHb Est-mCnc: 128 mg/dL
Hgb A1c MFr Bld: 6.1 % — ABNORMAL HIGH (ref 4.8–5.6)

## 2024-05-11 ENCOUNTER — Ambulatory Visit: Payer: Self-pay | Admitting: Family Medicine

## 2024-11-03 ENCOUNTER — Ambulatory Visit: Admitting: Family Medicine
